# Patient Record
Sex: Male | Born: 1950
Health system: Southern US, Community
[De-identification: ages and names within clinical notes are randomized; demographics above are authoritative.]

## PROBLEM LIST (undated history)

## (undated) DIAGNOSIS — M75102 Unspecified rotator cuff tear or rupture of left shoulder, not specified as traumatic: Secondary | ICD-10-CM

## (undated) DIAGNOSIS — N200 Calculus of kidney: Secondary | ICD-10-CM

## (undated) DIAGNOSIS — K219 Gastro-esophageal reflux disease without esophagitis: Secondary | ICD-10-CM

## (undated) DIAGNOSIS — G4730 Sleep apnea, unspecified: Secondary | ICD-10-CM

## (undated) DIAGNOSIS — E119 Type 2 diabetes mellitus without complications: Secondary | ICD-10-CM

## (undated) DIAGNOSIS — I10 Essential (primary) hypertension: Secondary | ICD-10-CM

## (undated) DIAGNOSIS — E785 Hyperlipidemia, unspecified: Secondary | ICD-10-CM

## (undated) DIAGNOSIS — A159 Respiratory tuberculosis unspecified: Secondary | ICD-10-CM

## (undated) DIAGNOSIS — R9439 Abnormal result of other cardiovascular function study: Secondary | ICD-10-CM

## (undated) DIAGNOSIS — M1990 Unspecified osteoarthritis, unspecified site: Secondary | ICD-10-CM

## (undated) DIAGNOSIS — I2510 Atherosclerotic heart disease of native coronary artery without angina pectoris: Secondary | ICD-10-CM

## (undated) DIAGNOSIS — I739 Peripheral vascular disease, unspecified: Secondary | ICD-10-CM

## (undated) HISTORY — PX: CORONARY ANGIOPLASTY: SHX604

## (undated) HISTORY — PX: CARDIAC CATHETERIZATION: SHX172

---

## 1978-11-13 HISTORY — PX: KNEE ARTHROSCOPY: SHX127

## 2011-02-11 ENCOUNTER — Encounter (HOSPITAL_BASED_OUTPATIENT_CLINIC_OR_DEPARTMENT_OTHER): Payer: Self-pay | Admitting: *Deleted

## 2011-02-11 NOTE — Pre-Procedure Instructions (Signed)
EKG and cardiology note reviewed by Dr. Krista Blue; pt. needs cardiac clearance for shoulder surg.  Raylene Miyamoto at Dr. Nolon Nations office notified.  Wife notified also of need for cardiac clearance; she will also contact Dr. Verl Dicker office for appt.

## 2011-02-11 NOTE — Pre-Procedure Instructions (Addendum)
To come for BMET  EKG and office note req. from Dr. Cletis Media office  Sleep study was done in [place]

## 2011-02-12 NOTE — H&P (Signed)
Cory Clarke is an 60 y.o. male.   Chief Complaint: left shoulder pain HPI: pt is having increasing l shoulder pain despite injection and therapeutic exercise. Recent mri scan shows a rotator cuff tear and mild djd.    Past Medical History  Diagnosis Date  . Hypertension     under control; has been on med. x 1 yr.  . Arthritis     shoulder and knee  . Diabetes mellitus     IDDM  . Hyperlipemia   . Rotator cuff tear, left     AC joint DJD  . Sleep apnea     no CPAP - did not return for follow-up after sleep study  . Peripheral vascular disease     blockages in legs    Past Surgical History  Procedure Date  . Knee surgery 1980s or 1990s  . Tonsillectomy as a child    History reviewed. No pertinent family history. Social History:  reports that he has been smoking Cigarettes.  He has a 20 pack-year smoking history. He has never used smokeless tobacco. He reports that he drinks alcohol. He reports that he does not use illicit drugs.  Allergies: Not on File  No current facility-administered medications on file as of .   No current outpatient prescriptions on file as of .    No results found for this or any previous visit (from the past 48 hour(s)). No results found.  Review of Systems  Constitutional: Negative.   HENT: Negative.   Eyes: Negative.   Respiratory: Negative.   Cardiovascular:       Positive for femoral artery blockage  Gastrointestinal: Negative.   Genitourinary: Negative.   Musculoskeletal:       Left shoulder pain  Skin: Negative.   Neurological: Negative.   Endo/Heme/Allergies:       Positive for dm2  Psychiatric/Behavioral: Negative.     Height 5\' 9"  (1.753 m), weight 83.915 kg (185 lb). Physical Exam  Constitutional: He is oriented to person, place, and time. He appears well-nourished.  HENT:  Head: Atraumatic.  Eyes: Pupils are equal, round, and reactive to light.  Neck: Normal range of motion.  Cardiovascular: Normal rate and regular  rhythm.   Respiratory: Effort normal and breath sounds normal.  GI: Soft.  Musculoskeletal:       Left shoulder -from, pain with impingement testing. Some cuff weakness  Neurological: He is alert and oriented to person, place, and time. He has normal reflexes.  Skin: Skin is warm.  Psychiatric: He has a normal mood and affect.     Assessment/Plan Left shoulder impingement and rct with mid djd. P:arthroscopy l shoulder with rcr to improve his condition  Lille Karim R 02/12/2011, 3:35 PM

## 2011-02-15 ENCOUNTER — Encounter (HOSPITAL_BASED_OUTPATIENT_CLINIC_OR_DEPARTMENT_OTHER): Admission: RE | Payer: Self-pay | Source: Ambulatory Visit

## 2011-02-15 ENCOUNTER — Ambulatory Visit (HOSPITAL_BASED_OUTPATIENT_CLINIC_OR_DEPARTMENT_OTHER): Admission: RE | Admit: 2011-02-15 | Payer: 59 | Source: Ambulatory Visit | Admitting: Orthopaedic Surgery

## 2011-02-15 HISTORY — DX: Hyperlipidemia, unspecified: E78.5

## 2011-02-15 HISTORY — DX: Unspecified osteoarthritis, unspecified site: M19.90

## 2011-02-15 HISTORY — DX: Unspecified rotator cuff tear or rupture of left shoulder, not specified as traumatic: M75.102

## 2011-02-15 HISTORY — DX: Essential (primary) hypertension: I10

## 2011-02-15 HISTORY — DX: Peripheral vascular disease, unspecified: I73.9

## 2011-02-15 HISTORY — DX: Sleep apnea, unspecified: G47.30

## 2011-02-15 SURGERY — ARTHROSCOPY, SHOULDER
Anesthesia: Choice | Laterality: Left

## 2011-03-17 ENCOUNTER — Other Ambulatory Visit: Payer: Self-pay | Admitting: Orthopaedic Surgery

## 2011-03-18 ENCOUNTER — Encounter (HOSPITAL_COMMUNITY): Payer: Self-pay

## 2011-03-24 ENCOUNTER — Inpatient Hospital Stay (HOSPITAL_COMMUNITY): Admission: RE | Admit: 2011-03-24 | Payer: 59 | Source: Ambulatory Visit

## 2011-03-29 ENCOUNTER — Encounter (HOSPITAL_COMMUNITY): Admission: RE | Payer: Self-pay | Source: Ambulatory Visit

## 2011-03-29 ENCOUNTER — Ambulatory Visit (HOSPITAL_COMMUNITY): Admission: RE | Admit: 2011-03-29 | Payer: 59 | Source: Ambulatory Visit | Admitting: Orthopaedic Surgery

## 2011-03-29 SURGERY — ARTHROSCOPY, SHOULDER
Anesthesia: General | Laterality: Left

## 2011-04-11 ENCOUNTER — Encounter (HOSPITAL_COMMUNITY): Payer: Self-pay | Admitting: Cardiology

## 2011-04-11 MED ORDER — CEFAZOLIN SODIUM-DEXTROSE 2-3 GM-% IV SOLR
2.0000 g | INTRAVENOUS | Status: DC
  Filled 2011-04-11: qty 50

## 2011-04-11 MED ORDER — LACTATED RINGERS IV SOLN: INTRAVENOUS | Status: DC

## 2011-04-11 NOTE — H&P (Signed)
CC f/u to discuss possible pv procedure per pt's request. dks (Dr Pixie Casino) (Appt time: 11:45 AM) (Arrival time: 11:44 AM) 5 HOPI: Patient was scheduled for left shoulder surgery. However patient needed cardiac risk modification. He has not had any stress test in greater than 2 years and was felt to be high risk for peri operative events given his multiple cardiovascular risks and severe peripheral artery disease. He underwent lexi scan nuclear stress test and presents here for followup.  Eyes: No blurred or double vision. Head: No headaches or migraines. Chest: No cough or shortness of breath. Heart: No chest pain, dyspnea, palpitations, syncope or hemoptysis. Patient c/o symptoms of cramping or discomfort in the lower extremities. Does not have leg cramping at night in either leg(s). The pain/discomfort is mostly located in bilateral calf. Starts after walking for 1/2 blocks to 2-300 feet. Relieved by rest for 30 seconds and then walks again. The pain is localized in the calf bilateral; left and ; Right equally affected. Pain is worse with activity and is easily relieved by resting. Has affected his lifestyle. H/0 Erectile dysfunction is present.   ROS: Arthritis bilateral knee and is s/p miniscus tear left knee, life-style limiting Mildly; new left shoulder rotator cuff injury. Patient states that this is getting better over time. Cramping of the legs and feet at night or with activity YES; Diabetes YES, Controlled YES; Hypothyroidism NO; Previous Stroke/TIA NO; Previous Gl Bleed NO; Erectile dysfunction YES used viagra in the past ; Recent weight change NO; Symptoms of obstructive sleep apnea NO; Other systems negative.   Past Medical History (Major events, hospitalizations, surgeries): Orthoscopic surgery on left knee in late 80's. Colonoscopy 2006.  Known allergies: NKDA.  Ongoing medical problems: Diabetes Type 2. Hypertension. Past history of hyperlipidemia. Poor circulation in the  legs.  Family medical history: Both parents diseased. Mom died at age 77 from cirrhosis of the liver. Dad died in early 84's from Choking on piece of meat. 2 brothers, one with glaucoma.  Social history: Married, with 3 kids, all in good health. Smoker (smokes 2 packs/day). Consumes occasional alcoholic beverages.   GENERAL: Moderately built and normal body habitus, who is in no acute distress. Alert and oriented x 3. Appears stated age. Height: I 66})] in Weight 132: Blood Pressure: 130 / 70 mmHg Pulse:72 bpm There is no cyanosis. CAROTIDS: No carotid bruits are noted. CARDIAC EXAM: 51 52 normal, no gallop or murmur. CHEST EXAM: No tenderness of chest wall. LUNGS: Clear to percuss and auscultate. No rales or ronchi. ABDOMEN: No hepatosplenomegaly. BS normal in all 4 quadrants. Abdomen is non-tender. EXTREMITY: No edema. MUSCULOSKELETAL EXAM: Intact with full range of motion in all 4 extremities. NEUROLOGIC EXAM: Grossly intact without any foca deficits. Alert 0 x 3. VASCULAR EXAM: No skin breakdown. Carotids Normal. Extremities: Femoral pulse normal with soft left worse than the right bruit. Popliteal pulse not palpable ; Pedal pulse absent. No prominent pulse felt in the abdomen. No varicose veins.  Assessment: 1. Abnormal stress test Suggesting multivessel coronary artery disease. Lexiscan stress 03/18/11: Mild LV dilatation. EF 46%. Cannot r/o balanced ischemia, multivessel CAD. Stress test suggests high risk for perioperative cardiovascular events. Needs further evaluation. 2. PAD/Claudication: Bilateral calf. Patient c/o symptoms of cramping or discomfort in the lower extremities. Does not have leg cramping at night in either leg(s). The pain/discomfort is mostly located in bilateral calf. Starts after walking for 1/2 blocks to 2-300 feet. LE arterial duplex 01/17/11; RABI 0.53, LAB I 0.47  suggesting severe PAD. Bilateral SFA stenosis. 3. Diabetes Mellitus II controlled. 4.  Hyperlipidemia. 5. Tobacco use disorder. Patient did not tolerate Chantix. He plans on quitting smoking soon.   DIAGNOSES: Atherosclerosis of native arteries of the extremities with intermittent claudication (440.21] type II diabetes mellitus (non-insulin dependent type) (NIDDM type) [adult-onset type) or unspecified type, not stated as uncontrolled, without mention of complication [250.00] Pure hypercholesterolemia [272.0] History of tobacco use (V15.82]   Plan:   Mechanism of underlying disease process and action of medications discussed with the patient. I discussed primary/secondary prevention and also dietary counceling was done. Continued tobcco use disorder: I have again reinforced abstinance. Unfortunatly continues to use. Continue current medications unchanged. Schedule for cardiac catheterization. We discussed regarding risks, benefits, alternatives to this including stress testing, CTA and continued medical therapy. Patient wants to proceed. Understands <1-2% risk of death, stroke, Ml, urgent CABG, bleeding, infection, renal failure but not limited to these. Patient was initially scheduled for left shoulder surgery but it has since been postponed due to abnormal stress test. Throat patient states that he would like to proceed with evaluation of his peripheral arterial disease as he states that this has been lifestyle limiting and he states that his shoulder has not been bothering her as much as it use to previously. I left this for the patient to decide but I am more concerned about coronary artery disease. I will make further recommendation of the cardiac catheterization.  REFERRALS: [facility] (Internal Medicine) Fax: [phone] Phone: [phone]

## 2011-04-12 ENCOUNTER — Encounter (HOSPITAL_COMMUNITY)
Admission: RE | Disposition: A | Discharge status: Home / Self Care | Payer: Self-pay | Source: Ambulatory Visit | Attending: Cardiology

## 2011-04-12 ENCOUNTER — Ambulatory Visit (HOSPITAL_COMMUNITY)
Admission: RE | Admit: 2011-04-12 | Discharge: 2011-04-12 | Disposition: A | Discharge status: Home / Self Care | Payer: 59 | Source: Ambulatory Visit | Attending: Cardiology | Admitting: Cardiology

## 2011-04-12 ENCOUNTER — Other Ambulatory Visit: Payer: Self-pay

## 2011-04-12 DIAGNOSIS — R9431 Abnormal electrocardiogram [ECG] [EKG]: Secondary | ICD-10-CM | POA: Insufficient documentation

## 2011-04-12 DIAGNOSIS — I2582 Chronic total occlusion of coronary artery: Secondary | ICD-10-CM | POA: Insufficient documentation

## 2011-04-12 DIAGNOSIS — I10 Essential (primary) hypertension: Secondary | ICD-10-CM | POA: Insufficient documentation

## 2011-04-12 DIAGNOSIS — I70219 Atherosclerosis of native arteries of extremities with intermittent claudication, unspecified extremity: Secondary | ICD-10-CM | POA: Insufficient documentation

## 2011-04-12 DIAGNOSIS — E7800 Pure hypercholesterolemia, unspecified: Secondary | ICD-10-CM | POA: Insufficient documentation

## 2011-04-12 DIAGNOSIS — R9439 Abnormal result of other cardiovascular function study: Secondary | ICD-10-CM | POA: Insufficient documentation

## 2011-04-12 DIAGNOSIS — I2510 Atherosclerotic heart disease of native coronary artery without angina pectoris: Secondary | ICD-10-CM | POA: Insufficient documentation

## 2011-04-12 DIAGNOSIS — E119 Type 2 diabetes mellitus without complications: Secondary | ICD-10-CM | POA: Insufficient documentation

## 2011-04-12 DIAGNOSIS — Z87891 Personal history of nicotine dependence: Secondary | ICD-10-CM | POA: Insufficient documentation

## 2011-04-12 HISTORY — DX: Abnormal result of other cardiovascular function study: R94.39

## 2011-04-12 HISTORY — PX: LEFT HEART CATHETERIZATION WITH CORONARY ANGIOGRAM: SHX5451

## 2011-04-12 LAB — GLUCOSE, CAPILLARY: Glucose-Capillary: 104 mg/dL — ABNORMAL HIGH (ref 70–99)

## 2011-04-12 SURGERY — LEFT HEART CATHETERIZATION WITH CORONARY ANGIOGRAM
Anesthesia: LOCAL

## 2011-04-12 MED ORDER — HEPARIN (PORCINE) IN NACL 2-0.9 UNIT/ML-% IJ SOLN
INTRAMUSCULAR | Status: AC
  Filled 2011-04-12: qty 2000

## 2011-04-12 MED ORDER — NITROGLYCERIN 0.2 MG/ML ON CALL CATH LAB
INTRAVENOUS | Status: AC
  Filled 2011-04-12: qty 1

## 2011-04-12 MED ORDER — SODIUM CHLORIDE 0.9 % IV SOLN
INTRAVENOUS | Status: DC
  Administered 2011-04-12: 1000 mL via INTRAVENOUS

## 2011-04-12 MED ORDER — SODIUM CHLORIDE 0.9 % IV SOLN: 1.0000 mL/kg/h | INTRAVENOUS | Status: DC

## 2011-04-12 MED ORDER — MIDAZOLAM HCL 2 MG/2ML IJ SOLN
INTRAMUSCULAR | Status: AC
  Filled 2011-04-12: qty 2

## 2011-04-12 MED ORDER — ASPIRIN 81 MG PO CHEW
324.0000 mg | CHEWABLE_TABLET | ORAL | Status: AC
  Administered 2011-04-12: 324 mg via ORAL
  Filled 2011-04-12: qty 4

## 2011-04-12 MED ORDER — SODIUM CHLORIDE 0.9 % IV SOLN: 250.0000 mL | INTRAVENOUS | Status: DC | PRN

## 2011-04-12 MED ORDER — BIVALIRUDIN 250 MG IV SOLR
INTRAVENOUS | Status: AC
  Filled 2011-04-12: qty 250

## 2011-04-12 MED ORDER — DEXTROSE 50 % IV SOLN
50.0000 mL | Freq: Once | INTRAVENOUS | Status: AC
  Administered 2011-04-12: 50 mL via INTRAVENOUS

## 2011-04-12 MED ORDER — ONDANSETRON HCL 4 MG/2ML IJ SOLN: 4.0000 mg | Freq: Four times a day (QID) | INTRAMUSCULAR | Status: DC | PRN

## 2011-04-12 MED ORDER — DEXTROSE 50 % IV SOLN
INTRAVENOUS | Status: AC
  Filled 2011-04-12: qty 50

## 2011-04-12 MED ORDER — VERAPAMIL HCL 2.5 MG/ML IV SOLN
INTRAVENOUS | Status: AC
  Filled 2011-04-12: qty 2

## 2011-04-12 MED ORDER — HYDROMORPHONE HCL PF 2 MG/ML IJ SOLN
INTRAMUSCULAR | Status: AC
  Filled 2011-04-12: qty 1

## 2011-04-12 MED ORDER — ACETAMINOPHEN 325 MG PO TABS: 650.0000 mg | ORAL_TABLET | ORAL | Status: DC | PRN

## 2011-04-12 MED ORDER — SODIUM CHLORIDE 0.9 % IJ SOLN: 3.0000 mL | INTRAMUSCULAR | Status: DC | PRN

## 2011-04-12 MED ORDER — ASPIRIN 81 MG PO CHEW: 81.0000 mg | CHEWABLE_TABLET | Freq: Every day | ORAL | Status: DC

## 2011-04-12 MED ORDER — SODIUM CHLORIDE 0.9 % IJ SOLN: 3.0000 mL | Freq: Two times a day (BID) | INTRAMUSCULAR | Status: DC

## 2011-04-12 MED ORDER — HEPARIN SODIUM (PORCINE) 1000 UNIT/ML IJ SOLN
INTRAMUSCULAR | Status: AC
  Filled 2011-04-12: qty 1

## 2011-04-12 MED ORDER — LIDOCAINE HCL (PF) 1 % IJ SOLN
INTRAMUSCULAR | Status: AC
  Filled 2011-04-12: qty 30

## 2011-04-12 NOTE — Cardiovascular Report (Signed)
NAME:  Cory, Clarke NO.:  [id number]  MEDICAL RECORD NO.:  [id number]  LOCATION:  [facility]                         FACILITY:  [facility]  PHYSICIAN:  [name], MD DATE OF BIRTH:  November 14, 1950  DATE OF PROCEDURE: DATE OF DISCHARGE:                           CARDIAC CATHETERIZATION   PROCEDURE PERFORMED:  Left heart catheterization including: 1. Left ventriculography. 2. Selective right and left coronary arteriography. 3. Percutaneous transluminal coronary angioplasty and angioplasty of     the chronically, totally occluded right coronary artery.  INDICATION:  Cory Clarke is a pleasant 61 year old gentleman with history of hyperlipidemia, diabetes, hypertension who was referred for preoperative cardiac risk evaluation.  He also has severe peripheral arterial disease.  He underwent outpatient stress testing on March 18, 2011, which had revealed ejection fraction of 46% and very strongly positive EKG changes of ischemia.  The multivessel coronary artery disease was suspected.  Given his multiple cardiovascular risk factors, he is now brought to the cardiac catheterization lab to evaluate his coronary anatomy.  HEMODYNAMIC DATA:  The left ventricular pressure was 97/4 with end- diastolic pressure of 7 mmHg.  Aortic pressure was 102/63 with a mean of 80 mmHg.  There was no pressure gradient across the aortic valve.  ANGIOGRAPHIC DATA:  Left ventricle.  Left ventricle systolic function was preserved with an ejection fraction of 50-55%.  There was no significant wall motion abnormality.  Left main coronary artery.  Left main coronary artery is a large-caliber vessel and is calcified without any luminal obstruction.  LAD.  LAD is a large-caliber vessel, giving origin to a moderate-sized diagonal 1, which has bifurcating branches.  This diagonal 1 has a 70% ostial stenosis.  It was extremely difficult to visualize this diagonal branch of the LAD in spite of  multiple angles.  It also gives origin to a large diagonal 2.  Again diffuse coronary calcification was evident throughout the proximal and mid LAD.  Ramus intermediate.  Ramus intermediate is a very large-caliber vessel with ostial 40% stenosis.  Distally, it is smooth and normal.  Circumflex artery.  Circumflex is a very large-caliber vessel.  It has an ostial 30% stenosis and a mid 40% stenosis.  Gives origin to a large obtuse marginal 1, which has a bifurcating high-grade 80% stenosis. This obtuse marginal 1 is moderate-sized vessel.  Distal circumflex itself has a focal 80% stenosis.  This distal circumflex coronary artery gives extensive collaterals to the right coronary artery.  Right coronary artery.  Right coronary artery is a large-caliber vessel and a dominant vessel.  It gives origin to large PDA and PL branches. It is occluded in the mid segment.  It has retrograde collaterals from the circumflex coronary artery and fills all the way back up to the RV branch.  INTERVENTION DATA:  I have carefully re-evaluated the coronary angiograms where the circumflex coronary artery stenoses are easily revascularizable by percutaneous coronary intervention.  However, given the fact that the right coronary artery is very large and the occlusion appeared to be short, I felt that it is feasible to proceed with intervention to the right coronary artery and if successful, do a staged intervention to the circumflex  coronary artery.  If there is a failure, consideration is for a RIMA to the right coronary artery with balloon angioplasty and stenting of the circumflex would also be contemplated.  The diagonal 1 although in the RAO cranial view appears to have high- grade stenosis, in the other views appeared to be at most 70%.  Also, the vessel is not necessarily very conducive for angioplasty although it is doable.  I attempted to angioplasty the CTO of the right coronary artery  with utilization of a [employer] 6.6 g.  I was able to cross the CTO. A 1.25 x 10-mm Sprinter Legend balloon was utilized for backup support. After I crossed the CTO, the distal vessel tortuosity was evident with heavy calcification.  Then, as the 1.25-mm balloon could not cross even after angioplasty, I decided to use a Finecross catheter.  Again, I was unable to cross the proximal cap.  After multiple attempts, I exchanged the [employer] wire to a [employer] wire.  In spite of this, I was able to get into the side branch but not into the true lumen of the distal right coronary artery.  Hence, the wires were withdrawn, angiography was repeated, guide catheter was disengaged and pulled out of the body.  RECOMMENDATION:  I will discuss the case again in detail with my colleagues and also with the patient and see whether repeat attempt should be performed of the CTO versus sending him for one-vessel bypass surgery versus continued medical therapy.  The circumflex coronary artery lesions are relatively state forward and conducive for angioplasty.  I will make further recommendation after review.  I have again discussed with the patient regarding smoking cessation and risk modification.  TECHNIQUE OF THE PROCEDURE:  Under sterile precautions, using a 6-French right radial access, a 5-French TIG #4 catheter was advanced into the ascending aorta and the left main coronary artery was selective engaged, and angiography was performed.  The catheter was also advanced into the left ventricle, and angiography was performed.  The catheter was then utilized to engage the right coronary artery, and angiography was repeated.  TECHNIQUE OF INTERVENTION:  Using heparin for anticoagulation, maintaining ACT greater than 250, I utilized an Ikari right 1.0 guide catheter to engage the right coronary artery.  I utilized a [employer] 6, followed by [employer], 1.25 x 10-mm Sprinter  Legend balloon, and a Finecross catheter to attempt angioplasty of the CTO. Because of inability to cross through the distal cap, the lesions were left alone.  The wires were withdrawn, angiography was repeated, guide catheter was disengaged and pulled out of the body.  The patient tolerated the procedure.  The patient did have transient chest discomfort which resolved at the end of the procedure.  There was no dissection or any perforation evident at the end of the angiography. The patient can safely be discharged home today with outpatient followup.     [name], MD     JRG/MEDQ  D:  04/12/2011  T:  04/12/2011  Job:  [id number]  cc:   Massie Maroon, MD

## 2011-04-12 NOTE — Brief Op Note (Signed)
  PATIENT: Cory Clarke  PRE-OPERATIVE DIAGNOSIS:  Abnormal stress test   POST-OPERATIVE DIAGNOSIS: CAD  PROCEDURE (S):  leftHEART CATHETERIZATION WITH CORONARY/LV ANGIOGRAM, PCI to CTO RCA failed  Cardiologist: Jeanella Cara, MD, [facility].:    Referring MD: PCP      DICTATION: .Other Dictation: Dictation Number [id number]   PATIENT DISPOSITION:  Short Stay

## 2011-04-12 NOTE — Interval H&P Note (Signed)
History and Physical Interval Note:  04/12/2011 7:40 AM  Cory Clarke  has presented today for surgery, with the diagnosis of positive stress test  The various methods of treatment have been discussed with the patient and family. After consideration of risks, benefits and other options for treatment, the patient has consented to  Procedure(s): LEFT HEART CATHETERIZATION WITH CORONARY ANGIOGRAM as a surgical intervention .  The patients' history has been reviewed, patient examined, no change in status, stable for surgery.  I have reviewed the patients' chart and labs.  Questions were answered to the patient's satisfaction.     [name],JAGADEESH R  No change since prior evaluation

## 2011-04-14 LAB — GLUCOSE, CAPILLARY: Glucose-Capillary: 66 mg/dL — ABNORMAL LOW (ref 70–99)

## 2011-04-14 MED FILL — Dextrose Inj 5%: INTRAVENOUS | Qty: 50 | Status: AC

## 2011-04-27 ENCOUNTER — Encounter (HOSPITAL_COMMUNITY): Payer: Self-pay | Admitting: Pharmacy Technician

## 2011-05-09 MED ORDER — CHLORHEXIDINE GLUCONATE 4 % EX LIQD
60.0000 mL | Freq: Once | CUTANEOUS | Status: DC
  Filled 2011-05-09: qty 60

## 2011-05-10 ENCOUNTER — Other Ambulatory Visit: Payer: Self-pay

## 2011-05-10 ENCOUNTER — Ambulatory Visit (HOSPITAL_COMMUNITY)
Admission: RE | Admit: 2011-05-10 | Discharge: 2011-05-10 | Disposition: A | Discharge status: Home / Self Care | Payer: 59 | Source: Ambulatory Visit | Attending: Cardiology | Admitting: Cardiology

## 2011-05-10 ENCOUNTER — Encounter (HOSPITAL_COMMUNITY)
Admission: RE | Disposition: A | Discharge status: Home / Self Care | Payer: Self-pay | Source: Ambulatory Visit | Attending: Cardiology

## 2011-05-10 DIAGNOSIS — E119 Type 2 diabetes mellitus without complications: Secondary | ICD-10-CM | POA: Insufficient documentation

## 2011-05-10 DIAGNOSIS — I2510 Atherosclerotic heart disease of native coronary artery without angina pectoris: Secondary | ICD-10-CM | POA: Insufficient documentation

## 2011-05-10 DIAGNOSIS — I739 Peripheral vascular disease, unspecified: Secondary | ICD-10-CM | POA: Insufficient documentation

## 2011-05-10 DIAGNOSIS — I10 Essential (primary) hypertension: Secondary | ICD-10-CM | POA: Insufficient documentation

## 2011-05-10 DIAGNOSIS — I700 Atherosclerosis of aorta: Secondary | ICD-10-CM | POA: Insufficient documentation

## 2011-05-10 HISTORY — PX: LOWER EXTREMITY ANGIOGRAM: SHX5508

## 2011-05-10 HISTORY — PX: CORONARY ANGIOGRAM: SHX5786

## 2011-05-10 HISTORY — PX: ABDOMINAL AORTAGRAM: SHX5454

## 2011-05-10 LAB — GLUCOSE, CAPILLARY
Glucose-Capillary: 115 mg/dL — ABNORMAL HIGH (ref 70–99)
Glucose-Capillary: 100 mg/dL — ABNORMAL HIGH (ref 70–99)

## 2011-05-10 SURGERY — ANGIOGRAM, LOWER EXTREMITY
Anesthesia: LOCAL

## 2011-05-10 MED ORDER — ADENOSINE 12 MG/4ML IV SOLN
12.0000 mL | Freq: Once | INTRAVENOUS | Status: DC
  Filled 2011-05-10: qty 12

## 2011-05-10 MED ORDER — ASPIRIN 81 MG PO CHEW
CHEWABLE_TABLET | ORAL | Status: AC
  Administered 2011-05-10: 324 mg
  Filled 2011-05-10: qty 4

## 2011-05-10 MED ORDER — HEPARIN (PORCINE) IN NACL 2-0.9 UNIT/ML-% IJ SOLN
INTRAMUSCULAR | Status: AC
  Filled 2011-05-10: qty 1000

## 2011-05-10 MED ORDER — ACETAMINOPHEN 325 MG PO TABS: 650.0000 mg | ORAL_TABLET | ORAL | Status: DC | PRN

## 2011-05-10 MED ORDER — FAMOTIDINE IN NACL 20-0.9 MG/50ML-% IV SOLN
INTRAVENOUS | Status: AC
  Administered 2011-05-10: 20 mg via INTRAVENOUS
  Filled 2011-05-10: qty 50

## 2011-05-10 MED ORDER — SODIUM CHLORIDE 0.9 % IV SOLN: INTRAVENOUS | Status: DC

## 2011-05-10 MED ORDER — HYDROMORPHONE HCL PF 2 MG/ML IJ SOLN
INTRAMUSCULAR | Status: AC
  Filled 2011-05-10: qty 1

## 2011-05-10 MED ORDER — ONDANSETRON HCL 4 MG/2ML IJ SOLN: 4.0000 mg | Freq: Four times a day (QID) | INTRAMUSCULAR | Status: DC | PRN

## 2011-05-10 MED ORDER — MIDAZOLAM HCL 2 MG/2ML IJ SOLN
INTRAMUSCULAR | Status: AC
  Filled 2011-05-10: qty 2

## 2011-05-10 MED ORDER — FAMOTIDINE IN NACL 20-0.9 MG/50ML-% IV SOLN
20.0000 mg | Freq: Once | INTRAVENOUS | Status: AC
  Administered 2011-05-10: 20 mg via INTRAVENOUS

## 2011-05-10 MED ORDER — FENTANYL CITRATE 0.05 MG/ML IJ SOLN
50.0000 ug | Freq: Once | INTRAMUSCULAR | Status: AC
  Administered 2011-05-10: 15:00:00 via INTRAVENOUS

## 2011-05-10 MED ORDER — SODIUM CHLORIDE 0.9 % IJ SOLN: 3.0000 mL | INTRAMUSCULAR | Status: DC | PRN

## 2011-05-10 MED ORDER — SODIUM CHLORIDE 0.9 % IV SOLN
INTRAVENOUS | Status: DC
  Administered 2011-05-10: 07:00:00 via INTRAVENOUS

## 2011-05-10 MED ORDER — SODIUM CHLORIDE 0.9 % IJ SOLN: 3.0000 mL | Freq: Two times a day (BID) | INTRAMUSCULAR | Status: DC

## 2011-05-10 MED ORDER — BIVALIRUDIN 250 MG IV SOLR
INTRAVENOUS | Status: AC
  Filled 2011-05-10: qty 250

## 2011-05-10 MED ORDER — ASPIRIN 81 MG PO CHEW: 324.0000 mg | CHEWABLE_TABLET | ORAL | Status: DC

## 2011-05-10 MED ORDER — LIDOCAINE HCL (PF) 1 % IJ SOLN
INTRAMUSCULAR | Status: AC
  Filled 2011-05-10: qty 30

## 2011-05-10 NOTE — CV Procedure (Signed)
Left heart cath, FFR to mid LAD, Abd Ao gram and bifemoral runoff. Tripple vessel CAD. Needs CABG.  Bilateral Short SFA occlusion eventually need angioplasty after coronary issues taken care of.  Procedure performed: 1. Coronary angiography and evaluation of LAD physiologic significance by FFR. 2. Abdominal aortogram 3. Abdominal aortogram with bifemoral runoff.  Indications: Patient is 61 year old male  with diabetes Presenting for evaluation of LAD CAD significance.  Dictation # [id number]

## 2011-05-10 NOTE — Cardiovascular Report (Signed)
Cory Clarke, Cory Clarke NO.:  [id number]  MEDICAL RECORD NO.:  [id number]  LOCATION:  [facility]                         FACILITY:  [facility]  PHYSICIAN:  [name], MD DATE OF BIRTH:  April 26, 1950  DATE OF PROCEDURE:  05/10/2011 DATE OF DISCHARGE:                           CARDIAC CATHETERIZATION   PROCEDURE PERFORMED: 1. Left coronary arteriogram. 2. Fractional flow reserve calculation of the mid left anterior     descending. 3. Abdominal aortogram with bifemoral runoff.  INDICATIONS:  Cory Clarke is a 61 year old gentleman with history of diabetes who has known peripheral arterial disease and coronary artery disease.  He had undergone outpatient stress testing which was abnormal revealing multivessel coronary artery disease.  Ejection fraction 45%. He had undergone cardiac catheterization on April 12, 2011, and at that time, had revealed preserved ejection fraction.  Right coronary artery was completely occluded in the proximal segment, which I had failed to angioplasty this because of inability to revascularize the CTO.  He was also found to have a high-grade stenosis in the distal circumflex, which was giving collaterals to the dominant right coronary artery.  Hence, he also had an intermediate stenosis in the LAD.  At that point, we had decided to proceed with FFR-guided angioplasty to the circumflex if the FFR was negative for the LAD stenosis.  However, given his diabetic state and multivessel disease if the LAD stenosis was significant, we were opting for coronary artery bypass grafting.  Abdominal aortogram with bifemoral runoff was performed to evaluate for peripheral arterial disease.  His ABI was 0.53 at 0.47 on the right and left respectively with evidence of bilateral calf claudication.  ANGIOGRAPHIC DATA:  Left main coronary artery:  Left main coronary artery was large caliber vessel with severe calcification.  There is a shelf-like  calcification noted at the superior margin.  LAD:  LAD is a large caliber vessel.  There is a 50% stenosis noted in the mid LAD.  However, by FFR, it was physiologically significant with the FFR dropping to 0.75 within 30 seconds of adenosine administration. The LAD gives origin to a very large diagonal 1, which also has a high- grade 90% stenosis.  It is at least moderate size.  Circumflex:  The circumflex is a large caliber vessel.  The previously noted distal circumflex coronary artery stenosis which was about 80-90%, now appears to be 60-70%.  The obtuse marginal 1 shows a 50-60% stenosis.  Proximal to mid segment of the large circumflex shows a 60% stenosis.  I suspect this is probably physiologically significant.  Ramus intermediate:  Ramus intermediate is a very large caliber vessel with severe proximal calcification.  There is a 30% stenosis in the ostial ramus intermediate.  DISCUSSION:  In a patient with very large right coronary artery which is collateralized by the LAD and circumflex coronary artery, the fact that the LAD stenosis is physiologically significant, and there is a large diagonal which has got ostial lesion which is not well visualized anatomically to place a stent.  The patient will be incompletely revascularized if we perform angioplasty to the diagonal and LAD leaving the right coronary artery which is large and not revascularized.  The  stress testing had also revealed severe multivessel coronary artery disease with LV cavity dilatation and decreasing ejection fraction of 46%.  Hence at this point, the best option long-term in a diabetic patient will be coronary artery bypass grafting involving the LIMA to LAD and bypassing the large diagonal and also bypassing the circumflex coronary artery and right coronary artery.  Abdominal aortogram:  Abdominal aortogram revealed 2 renal arteries, one on either sides and they were widely patent.  Mild calcification of  the abdominal aorta and mild luminal irregularity was evident.  Aortoiliac bifurcation was widely patent.  Right common iliac artery showed a 30% stenosis.  Abdominal aortogram with bifemoral runoff:  Abdominal aortogram with bifemoral runoff revealed diffuse severe calcification throughout the right superficial and left superficial femoral artery.  There is bilateral short segment occlusion of the superficial femoral artery.  In a similar fashion, just at the distal end of the adaptor canal.  Below the knee, there is three-vessel runoff bilaterally.  RECOMMENDATION:  The patient will need coronary artery bypass grafting first before we proceed with peripheral angiography and angioplasty of the same.  Both the lesions are amenable for percutaneous revascularization.  But however, I would prefer to perform these procedures only after his coronary anatomy has been evaluated and fixed.  The patient will be discharged home with outpatient evaluation by cardiothoracic surgical associates of mine.  TECHNIQUE OF THE PROCEDURE:  Under sterile precautions using a 6-French right femoral artery access, a 6-French XB 3.5 guide catheter was advanced into the ascending aorta and using Angiomax for anticoagulation, FFR guidewire was advanced into the LAD.  Intravenous adenosine was administered in a standard fashion.  Immediately at 30 seconds into administration of intravenous adenosine, the patient dropped his FFR from normalization to 0.75.  At this point, we felt that the lesion was significant.  The wire was withdrawn.  The guide catheter was disengaged and pulled out of the body.  Abdominal aortogram with bifemoral runoff was performed using a 5-French pigtail catheter.  Then, the catheter was pulled out of body in usual fashion.  The patient tolerated the procedure.  No immediate complications noted.     [name], MD     JRG/MEDQ  D:  05/10/2011  T:  05/10/2011  Job:   [id number]  cc:   Cory Maroon, MD

## 2011-05-10 NOTE — Discharge Instructions (Signed)

## 2011-05-10 NOTE — H&P (View-Only) (Signed)
CC f/u to discuss possible pv procedure per pt's request. dks (Dr J Kim) (Appt time: 11:45 AM) (Arrival time: 11:44 AM) 5 HOPI: Patient was scheduled for left shoulder surgery. However patient needed cardiac risk modification. He has not had any stress test in greater than 2 years and was felt to be high risk for peri operative events given his multiple cardiovascular risks and severe peripheral artery disease. He underwent lexi scan nuclear stress test and presents here for followup.  Eyes: No blurred or double vision. Head: No headaches or migraines. Chest: No cough or shortness of breath. Heart: No chest pain, dyspnea, palpitations, syncope or hemoptysis. Patient c/o symptoms of cramping or discomfort in the lower extremities. Does not have leg cramping at night in either leg(s). The pain/discomfort is mostly located in bilateral calf. Starts after walking for 1/2 blocks to 2-300 feet. Relieved by rest for 30 seconds and then walks again. The pain is localized in the calf bilateral; left and ; Right equally affected. Pain is worse with activity and is easily relieved by resting. Has affected his lifestyle. H/0 Erectile dysfunction is present.   ROS: Arthritis bilateral knee and is s/p miniscus tear left knee, life-style limiting Mildly; new left shoulder rotator cuff injury. Patient states that this is getting better over time. Cramping of the legs and feet at night or with activity YES; Diabetes YES, Controlled YES; Hypothyroidism NO; Previous Stroke/TIA NO; Previous Gl Bleed NO; Erectile dysfunction YES used viagra in the past ; Recent weight change NO; Symptoms of obstructive sleep apnea NO; Other systems negative.   Past Medical History (Major events, hospitalizations, surgeries): Orthoscopic surgery on left knee in late 80's. Colonoscopy 2006.  Known allergies: NKDA.  Ongoing medical problems: Diabetes Type 2. Hypertension. Past history of hyperlipidemia. Poor circulation in the  legs.  Family medical history: Both parents diseased. Mom died at age 85 from cirrhosis of the liver. Dad died in early 70's from Choking on piece of meat. 2 brothers, one with glaucoma.  Social history: Married, with 3 kids, all in good health. Smoker (smokes 2 packs/day). Consumes occasional alcoholic beverages.   GENERAL: Moderately built and normal body habitus, who is in no acute distress. Alert and oriented x 3. Appears stated age. Height: I 66})] in Weight 132: Blood Pressure: 130 / 70 mmHg Pulse:72 bpm There is no cyanosis. CAROTIDS: No carotid bruits are noted. CARDIAC EXAM: 51 52 normal, no gallop or murmur. CHEST EXAM: No tenderness of chest wall. LUNGS: Clear to percuss and auscultate. No rales or ronchi. ABDOMEN: No hepatosplenomegaly. BS normal in all 4 quadrants. Abdomen is non-tender. EXTREMITY: No edema. MUSCULOSKELETAL EXAM: Intact with full range of motion in all 4 extremities. NEUROLOGIC EXAM: Grossly intact without any foca deficits. Alert 0 x 3. VASCULAR EXAM: No skin breakdown. Carotids Normal. Extremities: Femoral pulse normal with soft left worse than the right bruit. Popliteal pulse not palpable ; Pedal pulse absent. No prominent pulse felt in the abdomen. No varicose veins.  Assessment: 1. Abnormal stress test Suggesting multivessel coronary artery disease. Lexiscan stress 03/18/11: Mild LV dilatation. EF 46%. Cannot r/o balanced ischemia, multivessel CAD. Stress test suggests high risk for perioperative cardiovascular events. Needs further evaluation. 2. PAD/Claudication: Bilateral calf. Patient c/o symptoms of cramping or discomfort in the lower extremities. Does not have leg cramping at night in either leg(s). The pain/discomfort is mostly located in bilateral calf. Starts after walking for 1/2 blocks to 2-300 feet. LE arterial duplex 01/17/11; RABI 0.53, LAB I 0.47   suggesting severe PAD. Bilateral SFA stenosis. 3. Diabetes Mellitus II controlled. 4.  Hyperlipidemia. 5. Tobacco use disorder. Patient did not tolerate Chantix. He plans on quitting smoking soon.   DIAGNOSES: Atherosclerosis of native arteries of the extremities with intermittent claudication (440.21] type II diabetes mellitus (non-insulin dependent type) (NIDDM type) [adult-onset type) or unspecified type, not stated as uncontrolled, without mention of complication [250.00] Pure hypercholesterolemia [272.0] History of tobacco use (V15.82]   Plan:   Mechanism of underlying disease process and action of medications discussed with the patient. I discussed primary/secondary prevention and also dietary counceling was done. Continued tobcco use disorder: I have again reinforced abstinance. Unfortunatly continues to use. Continue current medications unchanged. Schedule for cardiac catheterization. We discussed regarding risks, benefits, alternatives to this including stress testing, CTA and continued medical therapy. Patient wants to proceed. Understands <1-2% risk of death, stroke, Ml, urgent CABG, bleeding, infection, renal failure but not limited to these. Patient was initially scheduled for left shoulder surgery but it has since been postponed due to abnormal stress test. Throat patient states that he would like to proceed with evaluation of his peripheral arterial disease as he states that this has been lifestyle limiting and he states that his shoulder has not been bothering her as much as it use to previously. I left this for the patient to decide but I am more concerned about coronary artery disease. I will make further recommendation of the cardiac catheterization.  REFERRALS: James Kim (Internal Medicine) Fax: [phone] Phone: [phone]  

## 2011-05-10 NOTE — Interval H&P Note (Signed)
History and Physical Interval Note:  05/10/2011 7:52 AM  Francene Finders  has presented today for surgery, with the diagnosis of cad/claudication  The various methods of treatment have been discussed with the patient and family. After consideration of risks, benefits and other options for treatment, the patient has consented to  Procedure(s) (LRB): LOWER EXTREMITY ANGIOGRAM (N/A) ABDOMINAL AORTAGRAM (N/A) PERCUTANEOUS CORONARY STENT INTERVENTION (PCI-S) (N/A) as a surgical intervention .  The patients' history has been reviewed, patient examined, no change in status, stable for surgery.  I have reviewed the patients' chart and labs.  Questions were answered to the patient's satisfaction.     Mandeep Ferch,JAGADEESH R  No change in the HPI. Patient scheduled for elective coronary angioplasty if FFR LAD normal. Also possible evaluation for PAD.

## 2011-05-11 MED FILL — Dextrose Inj 5%: INTRAVENOUS | Qty: 50 | Status: AC

## 2011-05-11 MED FILL — Fentanyl Citrate Inj 0.05 MG/ML: INTRAMUSCULAR | Qty: 2 | Status: AC

## 2011-05-16 ENCOUNTER — Other Ambulatory Visit: Payer: Self-pay | Admitting: Thoracic Surgery (Cardiothoracic Vascular Surgery)

## 2011-05-16 ENCOUNTER — Institutional Professional Consult (permissible substitution) (INDEPENDENT_AMBULATORY_CARE_PROVIDER_SITE_OTHER): Payer: 59 | Admitting: Thoracic Surgery (Cardiothoracic Vascular Surgery)

## 2011-05-16 ENCOUNTER — Other Ambulatory Visit: Payer: Self-pay

## 2011-05-16 ENCOUNTER — Encounter: Payer: Self-pay | Admitting: Thoracic Surgery (Cardiothoracic Vascular Surgery)

## 2011-05-16 VITALS — BP 117/64 | HR 80 | Resp 20 | Ht 67.0 in | Wt 169.0 lb

## 2011-05-16 DIAGNOSIS — I2510 Atherosclerotic heart disease of native coronary artery without angina pectoris: Secondary | ICD-10-CM

## 2011-05-16 DIAGNOSIS — Z720 Tobacco use: Secondary | ICD-10-CM

## 2011-05-16 DIAGNOSIS — I739 Peripheral vascular disease, unspecified: Secondary | ICD-10-CM

## 2011-05-16 DIAGNOSIS — I10 Essential (primary) hypertension: Secondary | ICD-10-CM

## 2011-05-16 DIAGNOSIS — I779 Disorder of arteries and arterioles, unspecified: Secondary | ICD-10-CM

## 2011-05-16 DIAGNOSIS — E119 Type 2 diabetes mellitus without complications: Secondary | ICD-10-CM

## 2011-05-16 DIAGNOSIS — F17200 Nicotine dependence, unspecified, uncomplicated: Secondary | ICD-10-CM

## 2011-05-16 NOTE — Progress Notes (Signed)
PCP is Pearson Grippe, MD, MD Referring Provider is [name], MD  Chief Complaint  Patient presents with  . 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 W/u began when he was being assessed for left rotator cuff repair. Patient is unclear on the exact sequence of events, but he was not cleared from a cardiac perspective. He was found to have severe PAD with ABI of 0.52 on R and 0.47 on L. He had a stress test on 03/18/11 which showed mild global hypokinesis with an EF of 48%. Felt to have balanced ischemia. Cath on 04/12/11 showed total RCA, 90% circumflex. Attempted PTCA of RCA unsuccessful. Recath with FFR of LAD showed the LAD stenosis to hemodynamically significant. He has not had angina, but have frequent indigestion in the mornings relieved with belching. His exercise is limited by bilateral claudication.   Past Medical History  Diagnosis Date  . Hypertension     under control; has been on med. x 1 yr.  . Arthritis     shoulder and knee  . Diabetes mellitus     IDDM  . Hyperlipemia   . Rotator cuff tear, left     AC joint DJD  . Sleep apnea     no CPAP - did not return for follow-up after sleep study  . Peripheral vascular disease     blockages in legs  . Abnormal cardiovascular stress test     Past Surgical History  Procedure Date  . Knee surgery 1980s or 1990s  . Tonsillectomy as a child    No family history on file.  Social History History  Substance Use Topics  . Smoking status: Current Everyday Smoker -- 2.0 packs/day for 40 years    Types: Cigarettes  . Smokeless tobacco: Never Used  . Alcohol Use: Yes     3 x/wk.    Current Outpatient Prescriptions  Medication Sig Dispense Refill  . atorvastatin (LIPITOR) 40 MG tablet Take 40 mg by  mouth daily. PM       . Cholecalciferol (VITAMIN D-3) 5000 UNITS TABS Take 1 tablet by mouth daily.        . cilostazol (PLETAL) 100 MG tablet Take 100 mg by mouth 2 (two) times daily.        . insulin glargine (LANTUS) 100 UNIT/ML injection Inject 25-30 Units into the skin at bedtime.       Marland Kitchen KRILL OIL PO Take 2 capsules by mouth daily.       Marland Kitchen l-methylfolate-B6-B12 (METANX) 3-35-2 MG TABS Take 1 tablet by mouth 2 (two) times daily.        . meloxicam (MOBIC) 15 MG tablet Take 15 mg by mouth daily as needed. For muscle spasm.      . propranolol (INDERAL) 40 MG tablet Take 40 mg by mouth 3 (three) times daily.        . ramipril (ALTACE) 5 MG capsule Take 5 mg by mouth daily.        . sitaGLIPtin (JANUVIA) 100 MG tablet Take 100 mg by mouth daily.        No Known Allergies  Review of Systems  HENT: Positive for hearing loss.   Respiratory: Positive for wheezing.   Cardiovascular:       Claudication  Gastrointestinal:       Reflux   Genitourinary: Negative.   Musculoskeletal: Positive for myalgias, joint swelling and arthralgias.       Left rotator cuff tear- awaiting surgery  Neurological: Negative for dizziness, facial asymmetry, speech difficulty, weakness, light-headedness, numbness and headaches.  All other systems reviewed and are negative.    BP 117/64  Pulse 80  Resp 20  Ht 5\' 7"  (1.702 m)  Wt 169 lb (76.658 kg)  BMI 26.47 kg/m2  SpO2 94% Physical Exam  Constitutional: He is oriented to person, place, and time. He appears well-developed and well-nourished. No distress.  HENT:  Head: Normocephalic and atraumatic.  Eyes: EOM are normal. Pupils are equal, round, and reactive to light.  Neck: Neck supple. No JVD present.       +left carotid bruit  Cardiovascular: Normal rate, regular rhythm and normal heart sounds.        Absent DP/PT bilaterally Abnormal Allen's test left arm  Pulmonary/Chest: Breath sounds normal. No respiratory distress. He has no wheezes. He has  no rales.  Abdominal: Soft. There is no tenderness.  Musculoskeletal: He exhibits no edema.  Lymphadenopathy:    He has no cervical adenopathy.  Neurological: He is alert and oriented to person, place, and time. No cranial nerve deficit (no focal deficit).       No focal deficit   Skin: Skin is warm and dry.  Psychiatric: He has a normal mood and affect.     Diagnostic Tests: Stress test/ cardiac catheterizations reviewed.  Impression: 61 yo WM with multiple CRF and a positive stress test during a preoperative evaluation. He has 3 vessel CAD, and probably left main disease(calcified, small caliber) as well, with FFR evidence of significant impairment to flow in the LAD distribution. CABG is indicated for survival benefit. It is unclear if his indigestion/ belching is related to his heart or is GI in nature.   I had a long discussion with the patient and his wife and reviewed the most recent cath films with them. I discussed the indications, risks, benefits and alternative treatments with them. They understand the reasoning for the recommendation of CABG.   I have discussed with them the general nature of the procedure, need for general anesthesia,and incisions to be used. I discussed the expected hospital stay, overall recovery and short and long term outcomes. They understand the risks include but are not limited to death, stroke, MI, DVT/PE, bleeding, possible need for transfusion, infections, other organ system dysfunction including respiratory, renal, or GI complications. They understand and accept these risks and agree to proceed. They understand he is at increased, but not prohibitive, risk due to DM and PAD.  He has a left carotid bruit which needs to be evaluated. He also is a heavy smoker and needs preoperative PFTs.  He is not a candidate for bilateral IMA due to IDDM and we can't use left radial artery due to his markedly abnormal Allen's test on the left.  Plan: CABG on Monday  3/11

## 2011-05-17 ENCOUNTER — Encounter (HOSPITAL_COMMUNITY): Payer: Self-pay | Admitting: Pharmacy Technician

## 2011-05-19 ENCOUNTER — Other Ambulatory Visit (HOSPITAL_COMMUNITY): Payer: Self-pay | Admitting: Radiology

## 2011-05-19 ENCOUNTER — Other Ambulatory Visit: Payer: Self-pay

## 2011-05-19 ENCOUNTER — Encounter (HOSPITAL_COMMUNITY)
Admission: RE | Admit: 2011-05-19 | Discharge: 2011-05-19 | Disposition: A | Discharge status: Home / Self Care | Payer: 59 | Source: Ambulatory Visit | Attending: Thoracic Surgery (Cardiothoracic Vascular Surgery) | Admitting: Thoracic Surgery (Cardiothoracic Vascular Surgery)

## 2011-05-19 ENCOUNTER — Encounter (HOSPITAL_COMMUNITY): Payer: Self-pay | Admitting: Pharmacy Technician

## 2011-05-19 ENCOUNTER — Encounter (HOSPITAL_COMMUNITY): Payer: Self-pay

## 2011-05-19 ENCOUNTER — Ambulatory Visit (HOSPITAL_COMMUNITY)
Admission: RE | Admit: 2011-05-19 | Discharge: 2011-05-19 | Disposition: A | Discharge status: Home / Self Care | Payer: 59 | Source: Ambulatory Visit | Attending: Thoracic Surgery (Cardiothoracic Vascular Surgery) | Admitting: Thoracic Surgery (Cardiothoracic Vascular Surgery)

## 2011-05-19 ENCOUNTER — Inpatient Hospital Stay (HOSPITAL_COMMUNITY)
Admission: RE | Admit: 2011-05-19 | Discharge: 2011-05-19 | Disposition: A | Discharge status: Home / Self Care | Payer: 59 | Source: Ambulatory Visit | Attending: Thoracic Surgery (Cardiothoracic Vascular Surgery) | Admitting: Thoracic Surgery (Cardiothoracic Vascular Surgery)

## 2011-05-19 DIAGNOSIS — I2510 Atherosclerotic heart disease of native coronary artery without angina pectoris: Secondary | ICD-10-CM

## 2011-05-19 DIAGNOSIS — Z01811 Encounter for preprocedural respiratory examination: Secondary | ICD-10-CM | POA: Insufficient documentation

## 2011-05-19 DIAGNOSIS — Z01812 Encounter for preprocedural laboratory examination: Secondary | ICD-10-CM | POA: Insufficient documentation

## 2011-05-19 DIAGNOSIS — Z01810 Encounter for preprocedural cardiovascular examination: Secondary | ICD-10-CM | POA: Insufficient documentation

## 2011-05-19 DIAGNOSIS — J438 Other emphysema: Secondary | ICD-10-CM | POA: Insufficient documentation

## 2011-05-19 HISTORY — DX: Respiratory tuberculosis unspecified: A15.9

## 2011-05-19 HISTORY — DX: Gastro-esophageal reflux disease without esophagitis: K21.9

## 2011-05-19 LAB — PROTIME-INR
INR: 1.05 (ref 0.00–1.49)
Prothrombin Time: 13.9 seconds (ref 11.6–15.2)

## 2011-05-19 LAB — URINALYSIS, ROUTINE W REFLEX MICROSCOPIC
Bilirubin Urine: NEGATIVE
Ketones, ur: NEGATIVE mg/dL
Nitrite: NEGATIVE
Protein, ur: NEGATIVE mg/dL
pH: 6 (ref 5.0–8.0)

## 2011-05-19 LAB — COMPREHENSIVE METABOLIC PANEL
ALT: 16 U/L (ref 0–53)
AST: 15 U/L (ref 0–37)
Albumin: 3.2 g/dL — ABNORMAL LOW (ref 3.5–5.2)
Calcium: 9 mg/dL (ref 8.4–10.5)
Potassium: 4 mEq/L (ref 3.5–5.1)
Sodium: 137 mEq/L (ref 135–145)
Total Protein: 6.5 g/dL (ref 6.0–8.3)

## 2011-05-19 LAB — ABO/RH: ABO/RH(D): O POS

## 2011-05-19 LAB — CBC
MCH: 33 pg (ref 26.0–34.0)
MCHC: 34.7 g/dL (ref 30.0–36.0)
Platelets: 221 10*3/uL (ref 150–400)

## 2011-05-19 LAB — APTT: aPTT: 39 seconds — ABNORMAL HIGH (ref 24–37)

## 2011-05-19 LAB — TYPE AND SCREEN
ABO/RH(D): O POS
Antibody Screen: NEGATIVE

## 2011-05-19 MED ORDER — ALBUTEROL SULFATE (5 MG/ML) 0.5% IN NEBU
2.5000 mg | INHALATION_SOLUTION | Freq: Once | RESPIRATORY_TRACT | Status: AC
  Administered 2011-05-19: 2.5 mg via RESPIRATORY_TRACT

## 2011-05-19 NOTE — Pre-Procedure Instructions (Signed)
20 OBED SAMEK  05/19/2011   Your procedure is scheduled on:    Monday  05/23/11  Report to [facility] at 530 AM.  Call this number if you have problems the morning of surgery: [phone]   Remember:   Do not eat food:After Midnight.  May have clear liquids: up to 4 Hours before arrival.  Clear liquids include soda, tea, black coffee, apple or grape juice, broth.  Take these medicines the morning of surgery with A SIP OF WATER:  PROPANOLOL(INDERAL)    Do not wear jewelry, make-up or nail polish.  Do not wear lotions, powders, or perfumes. You may wear deodorant.  Do not shave 48 hours prior to surgery.  Do not bring valuables to the hospital.  Contacts, dentures or bridgework may not be worn into surgery.  Leave suitcase in the car. After surgery it may be brought to your room.  For patients admitted to the hospital, checkout time is 11:00 AM the day of discharge.   Patients discharged the day of surgery will not be allowed to drive home.  Name and phone number of your driver:   Special Instructions: CHG Shower Use Special Wash: 1/2 bottle night before surgery and 1/2 bottle morning of surgery.   Please read over the following fact sheets that you were given: Pain Booklet, Open Heart Packet, MRSA Information and Surgical Site Infection Prevention

## 2011-05-19 NOTE — Progress Notes (Signed)
Pre-op Cardiac Surgery  Carotid Findings:  Bilaterally no significant ICA stenosis with antegrade vertebral flow.  Upper Extremity Right Left  Brachial Pressures 120 124  Radial Waveforms Tri Tri  Ulnar Waveforms Tri Tri  Palmar Arch (Allen's Test) Waveform remains normal with radial compression and obliterates with ulnar compression Waveform decreases >50% with radial compression and remains normal with ulnar compression    Lower  Extremity Right Left  Dorsalis Pedis 62, Mono 53, Dampened mono  Peroneal Not audible Not audible  Posterior Tibial Not audible Not audible  Ankle/Brachial Indices 0.5 0.43    Findings:  Consistent with severe disease bilaterally.   Farrel Demark , RDMS

## 2011-05-19 NOTE — Progress Notes (Signed)
SPOKE WITH SUSAN AT DR HENDRICKSONS OFFICE RE IF PATIENT NEEDS TO STOP PLETAL, SHE WILL CHECK WITH DR HENDRICKSON AND CONTACT PATIENT.

## 2011-05-22 MED ORDER — PHENYLEPHRINE HCL 10 MG/ML IJ SOLN
30.0000 ug/min | INTRAVENOUS | Status: AC
  Administered 2011-05-23: 15 ug/min via INTRAVENOUS
  Filled 2011-05-22: qty 2

## 2011-05-22 MED ORDER — MAGNESIUM SULFATE 50 % IJ SOLN
40.0000 meq | INTRAMUSCULAR | Status: DC
  Filled 2011-05-22: qty 10

## 2011-05-22 MED ORDER — SODIUM CHLORIDE 0.9 % IV SOLN
INTRAVENOUS | Status: AC
  Administered 2011-05-23: 2.8 [IU]/h via INTRAVENOUS
  Filled 2011-05-22: qty 1

## 2011-05-22 MED ORDER — EPINEPHRINE HCL 1 MG/ML IJ SOLN
0.5000 ug/min | INTRAVENOUS | Status: DC
  Filled 2011-05-22: qty 4

## 2011-05-22 MED ORDER — DEXTROSE 5 % IV SOLN
1.5000 g | INTRAVENOUS | Status: DC
  Filled 2011-05-22: qty 1.5

## 2011-05-22 MED ORDER — DOPAMINE-DEXTROSE 3.2-5 MG/ML-% IV SOLN
2.0000 ug/kg/min | INTRAVENOUS | Status: DC
  Filled 2011-05-22: qty 250

## 2011-05-22 MED ORDER — NITROGLYCERIN IN D5W 200-5 MCG/ML-% IV SOLN
2.0000 ug/min | INTRAVENOUS | Status: DC
  Filled 2011-05-22: qty 250

## 2011-05-22 MED ORDER — SODIUM CHLORIDE 0.9 % IV SOLN
1250.0000 mg | INTRAVENOUS | Status: AC
  Administered 2011-05-23: 1250 mg via INTRAVENOUS
  Filled 2011-05-22: qty 1250

## 2011-05-22 MED ORDER — DEXTROSE 5 % IV SOLN
750.0000 mg | INTRAVENOUS | Status: DC
  Filled 2011-05-22: qty 750

## 2011-05-22 MED ORDER — POTASSIUM CHLORIDE 2 MEQ/ML IV SOLN
80.0000 meq | INTRAVENOUS | Status: DC
  Filled 2011-05-22: qty 40

## 2011-05-22 MED ORDER — VERAPAMIL HCL 2.5 MG/ML IV SOLN
INTRAVENOUS | Status: AC
  Administered 2011-05-23: 10:00:00
  Filled 2011-05-22: qty 2.5

## 2011-05-22 MED ORDER — SODIUM CHLORIDE 0.9 % IV SOLN
0.1000 ug/kg/h | INTRAVENOUS | Status: AC
  Administered 2011-05-23: .2 ug/kg/h via INTRAVENOUS
  Filled 2011-05-22: qty 4

## 2011-05-22 MED ORDER — SODIUM CHLORIDE 0.9 % IV SOLN
INTRAVENOUS | Status: AC
  Administered 2011-05-23: 70 mL/h via INTRAVENOUS
  Filled 2011-05-22: qty 40

## 2011-05-23 ENCOUNTER — Encounter (HOSPITAL_COMMUNITY)
Admission: RE | Disposition: A | Discharge status: Home / Self Care | Payer: Self-pay | Source: Ambulatory Visit | Attending: Thoracic Surgery (Cardiothoracic Vascular Surgery)

## 2011-05-23 ENCOUNTER — Other Ambulatory Visit: Payer: Self-pay

## 2011-05-23 ENCOUNTER — Encounter (HOSPITAL_COMMUNITY): Payer: Self-pay | Admitting: Anesthesiology

## 2011-05-23 ENCOUNTER — Ambulatory Visit (HOSPITAL_COMMUNITY): Payer: 59 | Admitting: Anesthesiology

## 2011-05-23 ENCOUNTER — Inpatient Hospital Stay (HOSPITAL_COMMUNITY): Payer: 59

## 2011-05-23 ENCOUNTER — Inpatient Hospital Stay (HOSPITAL_COMMUNITY)
Admission: RE | Admit: 2011-05-23 | Discharge: 2011-05-30 | DRG: 236 | Disposition: A | Discharge status: Home / Self Care | Payer: 59 | Source: Ambulatory Visit | Attending: Thoracic Surgery (Cardiothoracic Vascular Surgery) | Admitting: Thoracic Surgery (Cardiothoracic Vascular Surgery)

## 2011-05-23 ENCOUNTER — Encounter (HOSPITAL_COMMUNITY): Payer: Self-pay | Admitting: *Deleted

## 2011-05-23 DIAGNOSIS — K5900 Constipation, unspecified: Secondary | ICD-10-CM | POA: Diagnosis not present

## 2011-05-23 DIAGNOSIS — I2510 Atherosclerotic heart disease of native coronary artery without angina pectoris: Principal | ICD-10-CM | POA: Diagnosis present

## 2011-05-23 DIAGNOSIS — Z7982 Long term (current) use of aspirin: Secondary | ICD-10-CM

## 2011-05-23 DIAGNOSIS — I70219 Atherosclerosis of native arteries of extremities with intermittent claudication, unspecified extremity: Secondary | ICD-10-CM | POA: Diagnosis present

## 2011-05-23 DIAGNOSIS — D62 Acute posthemorrhagic anemia: Secondary | ICD-10-CM | POA: Diagnosis not present

## 2011-05-23 DIAGNOSIS — E119 Type 2 diabetes mellitus without complications: Secondary | ICD-10-CM | POA: Diagnosis present

## 2011-05-23 DIAGNOSIS — F17200 Nicotine dependence, unspecified, uncomplicated: Secondary | ICD-10-CM | POA: Diagnosis present

## 2011-05-23 DIAGNOSIS — E876 Hypokalemia: Secondary | ICD-10-CM | POA: Diagnosis not present

## 2011-05-23 DIAGNOSIS — I10 Essential (primary) hypertension: Secondary | ICD-10-CM | POA: Diagnosis present

## 2011-05-23 DIAGNOSIS — Z794 Long term (current) use of insulin: Secondary | ICD-10-CM

## 2011-05-23 DIAGNOSIS — M1710 Unilateral primary osteoarthritis, unspecified knee: Secondary | ICD-10-CM | POA: Diagnosis present

## 2011-05-23 DIAGNOSIS — D696 Thrombocytopenia, unspecified: Secondary | ICD-10-CM | POA: Diagnosis not present

## 2011-05-23 DIAGNOSIS — M19019 Primary osteoarthritis, unspecified shoulder: Secondary | ICD-10-CM | POA: Diagnosis present

## 2011-05-23 DIAGNOSIS — K219 Gastro-esophageal reflux disease without esophagitis: Secondary | ICD-10-CM | POA: Diagnosis present

## 2011-05-23 DIAGNOSIS — Z79899 Other long term (current) drug therapy: Secondary | ICD-10-CM

## 2011-05-23 DIAGNOSIS — E785 Hyperlipidemia, unspecified: Secondary | ICD-10-CM | POA: Diagnosis present

## 2011-05-23 DIAGNOSIS — E8779 Other fluid overload: Secondary | ICD-10-CM | POA: Diagnosis not present

## 2011-05-23 DIAGNOSIS — Z951 Presence of aortocoronary bypass graft: Secondary | ICD-10-CM

## 2011-05-23 HISTORY — PX: CORONARY ARTERY BYPASS GRAFT: SHX141

## 2011-05-23 LAB — POCT I-STAT, CHEM 8
BUN: 8 mg/dL (ref 6–23)
Calcium, Ion: 1.04 mmol/L — ABNORMAL LOW (ref 1.12–1.32)
TCO2: 23 mmol/L (ref 0–100)

## 2011-05-23 LAB — CREATININE, SERUM
Creatinine, Ser: 0.62 mg/dL (ref 0.50–1.35)
GFR calc Af Amer: >90 mL/min (ref >90)
GFR calc non Af Amer: >90 mL/min (ref >90)

## 2011-05-23 LAB — POCT I-STAT 4, (NA,K, GLUC, HGB,HCT)
Glucose, Bld: 153 mg/dL — ABNORMAL HIGH (ref 70–99)
Glucose, Bld: 87 mg/dL (ref 70–99)
Glucose, Bld: 127 mg/dL — ABNORMAL HIGH (ref 70–99)
Glucose, Bld: 143 mg/dL — ABNORMAL HIGH (ref 70–99)
HCT: 39 % (ref 39.0–52.0)
HCT: 30 % — ABNORMAL LOW (ref 39.0–52.0)
Hemoglobin: 13.3 g/dL (ref 13.0–17.0)
Hemoglobin: 9.5 g/dL — ABNORMAL LOW (ref 13.0–17.0)
Hemoglobin: 9.5 g/dL — ABNORMAL LOW (ref 13.0–17.0)
Hemoglobin: 10.5 g/dL — ABNORMAL LOW (ref 13.0–17.0)
Potassium: 4.1 mEq/L (ref 3.5–5.1)
Potassium: 2.9 mEq/L — ABNORMAL LOW (ref 3.5–5.1)
Sodium: 139 mEq/L (ref 135–145)
Sodium: 138 mEq/L (ref 135–145)

## 2011-05-23 LAB — BLOOD GAS, ARTERIAL
Bicarbonate: 25.9 mEq/L — ABNORMAL HIGH (ref 20.0–24.0)
Patient temperature: 98.6
TCO2: 27.1 mmol/L (ref 0–100)
pCO2 arterial: 40.5 mmHg (ref 35.0–45.0)
pH, Arterial: 7.421 (ref 7.350–7.450)

## 2011-05-23 LAB — PLATELET COUNT: Platelets: 140 10*3/uL — ABNORMAL LOW (ref 150–400)

## 2011-05-23 LAB — CBC
HCT: 29.8 % — ABNORMAL LOW (ref 39.0–52.0)
Hemoglobin: 9.5 g/dL — ABNORMAL LOW (ref 13.0–17.0)
MCH: 33.1 pg (ref 26.0–34.0)
MCH: 33.2 pg (ref 26.0–34.0)
MCHC: 35.2 g/dL (ref 30.0–36.0)
MCHC: 34.7 g/dL (ref 30.0–36.0)
Platelets: 141 10*3/uL — ABNORMAL LOW (ref 150–400)
RDW: 13.6 % (ref 11.5–15.5)
RDW: 13.8 % (ref 11.5–15.5)

## 2011-05-23 LAB — POCT I-STAT 3, ART BLOOD GAS (G3+)
Acid-base deficit: 2 mmol/L (ref 0.0–2.0)
Acid-base deficit: 3 mmol/L — ABNORMAL HIGH (ref 0.0–2.0)
Bicarbonate: 23.9 mEq/L (ref 20.0–24.0)
Bicarbonate: 23.2 mEq/L (ref 20.0–24.0)
O2 Saturation: 100 %
Patient temperature: 35.5
Patient temperature: 37.5
Patient temperature: 37.9
TCO2: 26 mmol/L (ref 0–100)
TCO2: 24 mmol/L (ref 0–100)
pCO2 arterial: 46.5 mmHg — ABNORMAL HIGH (ref 35.0–45.0)
pCO2 arterial: 34.4 mmHg — ABNORMAL LOW (ref 35.0–45.0)
pH, Arterial: 7.432 (ref 7.350–7.450)
pH, Arterial: 7.333 — ABNORMAL LOW (ref 7.350–7.450)
pO2, Arterial: 328 mmHg — ABNORMAL HIGH (ref 80.0–100.0)
pO2, Arterial: 89 mmHg (ref 80.0–100.0)

## 2011-05-23 LAB — GLUCOSE, CAPILLARY
Glucose-Capillary: 175 mg/dL — ABNORMAL HIGH (ref 70–99)
Glucose-Capillary: 125 mg/dL — ABNORMAL HIGH (ref 70–99)
Glucose-Capillary: 143 mg/dL — ABNORMAL HIGH (ref 70–99)
Glucose-Capillary: 143 mg/dL — ABNORMAL HIGH (ref 70–99)

## 2011-05-23 LAB — MAGNESIUM: Magnesium: 2.8 mg/dL — ABNORMAL HIGH (ref 1.5–2.5)

## 2011-05-23 LAB — PROTIME-INR: INR: 1.33 (ref 0.00–1.49)

## 2011-05-23 LAB — HEMOGLOBIN AND HEMATOCRIT, BLOOD: HCT: 29.5 % — ABNORMAL LOW (ref 39.0–52.0)

## 2011-05-23 SURGERY — CORONARY ARTERY BYPASS GRAFTING (CABG)
Anesthesia: General | Site: Chest | Wound class: Clean

## 2011-05-23 MED ORDER — FENTANYL CITRATE 0.05 MG/ML IJ SOLN
INTRAMUSCULAR | Status: DC | PRN
  Administered 2011-05-23: 150 ug via INTRAVENOUS
  Administered 2011-05-23: 250 ug via INTRAVENOUS
  Administered 2011-05-23: 100 ug via INTRAVENOUS
  Administered 2011-05-23: 250 ug via INTRAVENOUS
  Administered 2011-05-23: 150 ug via INTRAVENOUS
  Administered 2011-05-23: 50 ug via INTRAVENOUS
  Administered 2011-05-23: 150 ug via INTRAVENOUS
  Administered 2011-05-23: 50 ug via INTRAVENOUS
  Administered 2011-05-23: 100 ug via INTRAVENOUS

## 2011-05-23 MED ORDER — PROTAMINE SULFATE 10 MG/ML IV SOLN
INTRAVENOUS | Status: DC | PRN
  Administered 2011-05-23: 25 mg via INTRAVENOUS

## 2011-05-23 MED ORDER — DEXTROSE 5 % IV SOLN
1.5000 g | Freq: Two times a day (BID) | INTRAVENOUS | Status: DC
  Administered 2011-05-23 – 2011-05-24 (×2): 1.5 g via INTRAVENOUS
  Filled 2011-05-23 (×3): qty 1.5

## 2011-05-23 MED ORDER — DOCUSATE SODIUM 100 MG PO CAPS
200.0000 mg | ORAL_CAPSULE | Freq: Every day | ORAL | Status: DC
  Administered 2011-05-24: 200 mg via ORAL
  Filled 2011-05-23: qty 2

## 2011-05-23 MED ORDER — ACETAMINOPHEN 650 MG RE SUPP
650.0000 mg | RECTAL | Status: AC
  Administered 2011-05-23: 650 mg via RECTAL

## 2011-05-23 MED ORDER — SODIUM CHLORIDE 0.9 % IV SOLN
0.1000 ug/kg/h | INTRAVENOUS | Status: DC
  Administered 2011-05-24: 0.1 ug/kg/h via INTRAVENOUS
  Filled 2011-05-23: qty 2

## 2011-05-23 MED ORDER — POTASSIUM CHLORIDE 10 MEQ/50ML IV SOLN
10.0000 meq | INTRAVENOUS | Status: AC
  Administered 2011-05-23 (×3): 10 meq via INTRAVENOUS

## 2011-05-23 MED ORDER — SODIUM CHLORIDE 0.9 % IJ SOLN: 3.0000 mL | INTRAMUSCULAR | Status: DC | PRN

## 2011-05-23 MED ORDER — BISACODYL 5 MG PO TBEC
10.0000 mg | DELAYED_RELEASE_TABLET | Freq: Every day | ORAL | Status: DC
  Administered 2011-05-24: 10 mg via ORAL
  Filled 2011-05-23: qty 2

## 2011-05-23 MED ORDER — MORPHINE SULFATE 2 MG/ML IJ SOLN
1.0000 mg | INTRAMUSCULAR | Status: DC | PRN
  Administered 2011-05-23 (×3): 2 mg via INTRAVENOUS
  Filled 2011-05-23 (×3): qty 1

## 2011-05-23 MED ORDER — HETASTARCH-ELECTROLYTES 6 % IV SOLN
INTRAVENOUS | Status: DC | PRN
  Administered 2011-05-23: 13:00:00 via INTRAVENOUS

## 2011-05-23 MED ORDER — LACTATED RINGERS IV SOLN
INTRAVENOUS | Status: DC | PRN
  Administered 2011-05-23: 07:00:00 via INTRAVENOUS

## 2011-05-23 MED ORDER — MAGNESIUM SULFATE 40 MG/ML IJ SOLN
4.0000 g | Freq: Once | INTRAMUSCULAR | Status: AC
  Administered 2011-05-23: 4 g via INTRAVENOUS
  Filled 2011-05-23: qty 100

## 2011-05-23 MED ORDER — ACETAMINOPHEN 160 MG/5ML PO SOLN
975.0000 mg | Freq: Four times a day (QID) | ORAL | Status: DC
  Filled 2011-05-23: qty 40.6

## 2011-05-23 MED ORDER — PANTOPRAZOLE SODIUM 40 MG PO TBEC
40.0000 mg | DELAYED_RELEASE_TABLET | Freq: Every day | ORAL | Status: DC
  Administered 2011-05-25 – 2011-05-30 (×6): 40 mg via ORAL
  Filled 2011-05-23 (×6): qty 1

## 2011-05-23 MED ORDER — LACTATED RINGERS IV SOLN
INTRAVENOUS | Status: DC
  Administered 2011-05-23: 14:00:00 via INTRAVENOUS

## 2011-05-23 MED ORDER — ASPIRIN 81 MG PO CHEW: 324.0000 mg | CHEWABLE_TABLET | Freq: Every day | ORAL | Status: DC

## 2011-05-23 MED ORDER — PROPRANOLOL HCL 40 MG PO TABS
40.0000 mg | ORAL_TABLET | Freq: Three times a day (TID) | ORAL | Status: DC
  Administered 2011-05-23: 40 mg via ORAL
  Filled 2011-05-23 (×3): qty 1

## 2011-05-23 MED ORDER — ASPIRIN EC 325 MG PO TBEC
325.0000 mg | DELAYED_RELEASE_TABLET | Freq: Every day | ORAL | Status: DC
  Administered 2011-05-24 – 2011-05-30 (×7): 325 mg via ORAL
  Filled 2011-05-23 (×7): qty 1

## 2011-05-23 MED ORDER — METOPROLOL TARTRATE 1 MG/ML IV SOLN: 2.5000 mg | INTRAVENOUS | Status: DC | PRN

## 2011-05-23 MED ORDER — METOPROLOL TARTRATE 12.5 MG HALF TABLET
12.5000 mg | ORAL_TABLET | Freq: Two times a day (BID) | ORAL | Status: DC
  Administered 2011-05-24: 12.5 mg via ORAL
  Filled 2011-05-23 (×5): qty 1

## 2011-05-23 MED ORDER — VANCOMYCIN HCL 1000 MG IV SOLR
1000.0000 mg | Freq: Once | INTRAVENOUS | Status: AC
  Administered 2011-05-23: 1000 mg via INTRAVENOUS
  Filled 2011-05-23: qty 1000

## 2011-05-23 MED ORDER — ACETAMINOPHEN 160 MG/5ML PO SOLN: 650.0000 mg | ORAL | Status: AC

## 2011-05-23 MED ORDER — METOPROLOL TARTRATE 12.5 MG HALF TABLET: 12.5000 mg | ORAL_TABLET | Freq: Once | ORAL | Status: DC

## 2011-05-23 MED ORDER — PROPOFOL 10 MG/ML IV EMUL
INTRAVENOUS | Status: DC | PRN
  Administered 2011-05-23: 100 mg via INTRAVENOUS

## 2011-05-23 MED ORDER — LIDOCAINE HCL (CARDIAC) 20 MG/ML IV SOLN
INTRAVENOUS | Status: DC | PRN
  Administered 2011-05-23: 50 mg via INTRAVENOUS

## 2011-05-23 MED ORDER — SODIUM CHLORIDE 0.9 % IV SOLN: 250.0000 mL | INTRAVENOUS | Status: DC

## 2011-05-23 MED ORDER — KETOROLAC TROMETHAMINE 30 MG/ML IJ SOLN
30.0000 mg | Freq: Four times a day (QID) | INTRAMUSCULAR | Status: AC
  Administered 2011-05-23 – 2011-05-24 (×4): 30 mg via INTRAVENOUS
  Filled 2011-05-23 (×4): qty 1

## 2011-05-23 MED ORDER — BISACODYL 10 MG RE SUPP: 10.0000 mg | Freq: Every day | RECTAL | Status: DC

## 2011-05-23 MED ORDER — CHLORHEXIDINE GLUCONATE 4 % EX LIQD: 30.0000 mL | CUTANEOUS | Status: DC

## 2011-05-23 MED ORDER — ROCURONIUM BROMIDE 100 MG/10ML IV SOLN
INTRAVENOUS | Status: DC | PRN
  Administered 2011-05-23: 100 mg via INTRAVENOUS
  Administered 2011-05-23: 50 mg via INTRAVENOUS

## 2011-05-23 MED ORDER — VECURONIUM BROMIDE 10 MG IV SOLR
INTRAVENOUS | Status: DC | PRN
  Administered 2011-05-23: 7 mg via INTRAVENOUS
  Administered 2011-05-23: 3 mg via INTRAVENOUS

## 2011-05-23 MED ORDER — SODIUM CHLORIDE 0.45 % IV SOLN
INTRAVENOUS | Status: DC
  Administered 2011-05-23: 20 mL via INTRAVENOUS

## 2011-05-23 MED ORDER — SODIUM CHLORIDE 0.9 % IJ SOLN: 3.0000 mL | Freq: Two times a day (BID) | INTRAMUSCULAR | Status: DC

## 2011-05-23 MED ORDER — ONDANSETRON HCL 4 MG/2ML IJ SOLN: 4.0000 mg | Freq: Four times a day (QID) | INTRAMUSCULAR | Status: DC | PRN

## 2011-05-23 MED ORDER — ALBUMIN HUMAN 5 % IV SOLN
250.0000 mL | INTRAVENOUS | Status: AC | PRN
  Administered 2011-05-23 – 2011-05-24 (×4): 250 mL via INTRAVENOUS
  Filled 2011-05-23 (×2): qty 250

## 2011-05-23 MED ORDER — OXYCODONE HCL 5 MG PO TABS
5.0000 mg | ORAL_TABLET | ORAL | Status: DC | PRN
  Administered 2011-05-24 – 2011-05-25 (×5): 10 mg via ORAL
  Administered 2011-05-26: 5 mg via ORAL
  Administered 2011-05-26: 10 mg via ORAL
  Administered 2011-05-26 – 2011-05-27 (×2): 5 mg via ORAL
  Filled 2011-05-23 (×2): qty 2
  Filled 2011-05-23 (×2): qty 1
  Filled 2011-05-23: qty 2
  Filled 2011-05-23: qty 1
  Filled 2011-05-23: qty 2
  Filled 2011-05-23: qty 1
  Filled 2011-05-23 (×2): qty 2

## 2011-05-23 MED ORDER — DOPAMINE-DEXTROSE 1.6-5 MG/ML-% IV SOLN
INTRAVENOUS | Status: DC | PRN
  Administered 2011-05-23: 3 ug/kg/min via INTRAVENOUS

## 2011-05-23 MED ORDER — ALBUMIN HUMAN 5 % IV SOLN
INTRAVENOUS | Status: DC | PRN
  Administered 2011-05-23: 12:00:00 via INTRAVENOUS

## 2011-05-23 MED ORDER — INSULIN REGULAR BOLUS VIA INFUSION
0.0000 [IU] | Freq: Three times a day (TID) | INTRAVENOUS | Status: DC
  Administered 2011-05-23: 0 [IU] via INTRAVENOUS
  Filled 2011-05-23: qty 10

## 2011-05-23 MED ORDER — PHENYLEPHRINE HCL 10 MG/ML IJ SOLN
0.0000 ug/min | INTRAVENOUS | Status: DC
  Administered 2011-05-24: 30 ug/min via INTRAVENOUS
  Filled 2011-05-23 (×2): qty 2

## 2011-05-23 MED ORDER — MORPHINE SULFATE 4 MG/ML IJ SOLN
2.0000 mg | INTRAMUSCULAR | Status: DC | PRN
  Administered 2011-05-23 – 2011-05-25 (×9): 4 mg via INTRAVENOUS
  Filled 2011-05-23 (×9): qty 1

## 2011-05-23 MED ORDER — MIDAZOLAM HCL 5 MG/5ML IJ SOLN
INTRAMUSCULAR | Status: DC | PRN
  Administered 2011-05-23 (×2): 2 mg via INTRAVENOUS
  Administered 2011-05-23: 1 mg via INTRAVENOUS
  Administered 2011-05-23: 4 mg via INTRAVENOUS
  Administered 2011-05-23 (×2): 2 mg via INTRAVENOUS

## 2011-05-23 MED ORDER — MIDAZOLAM HCL 2 MG/2ML IJ SOLN: 2.0000 mg | INTRAMUSCULAR | Status: DC | PRN

## 2011-05-23 MED ORDER — NITROGLYCERIN IN D5W 200-5 MCG/ML-% IV SOLN
0.0000 ug/min | INTRAVENOUS | Status: DC
  Administered 2011-05-23: 0 ug/min via INTRAVENOUS

## 2011-05-23 MED ORDER — SODIUM CHLORIDE 0.9 % IJ SOLN
OROMUCOSAL | Status: DC | PRN
  Administered 2011-05-23: 10:00:00 via TOPICAL

## 2011-05-23 MED ORDER — SODIUM CHLORIDE 0.9 % IV SOLN
INTRAVENOUS | Status: DC
  Administered 2011-05-23: 1.7 [IU]/h via INTRAVENOUS
  Administered 2011-05-23: 2.5 [IU]/h via INTRAVENOUS
  Filled 2011-05-23: qty 1

## 2011-05-23 MED ORDER — SODIUM CHLORIDE 0.9 % IV SOLN: INTRAVENOUS | Status: DC

## 2011-05-23 MED ORDER — ATORVASTATIN CALCIUM 40 MG PO TABS
40.0000 mg | ORAL_TABLET | Freq: Every day | ORAL | Status: DC
  Filled 2011-05-23: qty 1

## 2011-05-23 MED ORDER — METOPROLOL TARTRATE 25 MG/10 ML ORAL SUSPENSION
12.5000 mg | Freq: Two times a day (BID) | ORAL | Status: DC
  Filled 2011-05-23 (×5): qty 5

## 2011-05-23 MED ORDER — 0.9 % SODIUM CHLORIDE (POUR BTL) OPTIME
TOPICAL | Status: DC | PRN
  Administered 2011-05-23: 6000 mL

## 2011-05-23 MED ORDER — PROTAMINE SULFATE 10 MG/ML IV SOLN
25.0000 mg | Freq: Once | INTRAVENOUS | Status: AC
  Administered 2011-05-23: 25 mg via INTRAVENOUS

## 2011-05-23 MED ORDER — HEMOSTATIC AGENTS (NO CHARGE) OPTIME
TOPICAL | Status: DC | PRN
  Administered 2011-05-23: 1 via TOPICAL

## 2011-05-23 MED ORDER — ACETAMINOPHEN 500 MG PO TABS
1000.0000 mg | ORAL_TABLET | Freq: Four times a day (QID) | ORAL | Status: DC
  Administered 2011-05-23 – 2011-05-24 (×4): 1000 mg via ORAL
  Filled 2011-05-23 (×8): qty 2

## 2011-05-23 MED ORDER — FAMOTIDINE IN NACL 20-0.9 MG/50ML-% IV SOLN
20.0000 mg | Freq: Two times a day (BID) | INTRAVENOUS | Status: DC
  Administered 2011-05-23: 20 mg via INTRAVENOUS

## 2011-05-23 MED ORDER — PROTAMINE SULFATE 10 MG/ML IV SOLN
INTRAVENOUS | Status: AC
  Filled 2011-05-23: qty 5

## 2011-05-23 MED ORDER — LACTATED RINGERS IV SOLN: 500.0000 mL | Freq: Once | INTRAVENOUS | Status: AC | PRN

## 2011-05-23 MED ORDER — HEPARIN SODIUM (PORCINE) 1000 UNIT/ML IJ SOLN
INTRAMUSCULAR | Status: DC | PRN
  Administered 2011-05-23: 2000 [IU] via INTRAVENOUS
  Administered 2011-05-23: 23000 [IU] via INTRAVENOUS

## 2011-05-23 MED ORDER — DOPAMINE-DEXTROSE 3.2-5 MG/ML-% IV SOLN: 0.0000 ug/kg/min | INTRAVENOUS | Status: DC

## 2011-05-23 SURGICAL SUPPLY — 101 items
ADAPTER CARDIO PERF ANTE/RETRO (ADAPTER) IMPLANT
APPLIER CLIP 9.375 SM OPEN (CLIP)
ATTRACTOMAT 16X20 MAGNETIC DRP (DRAPES) ×2 IMPLANT
BAG DECANTER FOR FLEXI CONT (MISCELLANEOUS) ×2 IMPLANT
BANDAGE ELASTIC 4 VELCRO ST LF (GAUZE/BANDAGES/DRESSINGS) ×2 IMPLANT
BANDAGE ELASTIC 6 VELCRO ST LF (GAUZE/BANDAGES/DRESSINGS) ×2 IMPLANT
BANDAGE GAUZE ELAST BULKY 4 IN (GAUZE/BANDAGES/DRESSINGS) ×2 IMPLANT
BASKET HEART (ORDER IN 25'S) (MISCELLANEOUS) ×1
BASKET HEART (ORDER IN 25S) (MISCELLANEOUS) ×1 IMPLANT
BLADE SAW STERNAL (BLADE) ×2 IMPLANT
BLADE SURG 11 STRL SS (BLADE) IMPLANT
CANISTER SUCTION 2500CC (MISCELLANEOUS) ×2 IMPLANT
CANN PRFSN .5XCNCT 15X34-48 (MISCELLANEOUS) ×1
CANNULA GUNDRY RCSP 15FR (MISCELLANEOUS) IMPLANT
CANNULA PRFSN .5XCNCT 15X34-48 (MISCELLANEOUS) ×1 IMPLANT
CANNULA VEN 2 STAGE (MISCELLANEOUS) ×1
CATH CPB KIT HENDRICKSON (MISCELLANEOUS) ×2 IMPLANT
CATH ROBINSON RED A/P 18FR (CATHETERS) ×4 IMPLANT
CATH THORACIC 36FR (CATHETERS) ×2 IMPLANT
CATH THORACIC 36FR RT ANG (CATHETERS) ×4 IMPLANT
CLIP APPLIE 9.375 SM OPEN (CLIP) IMPLANT
CLIP FOGARTY SPRING 6M (CLIP) ×4 IMPLANT
CLIP TI MEDIUM 24 (CLIP) IMPLANT
CLIP TI WIDE RED SMALL 24 (CLIP) ×4 IMPLANT
CLOTH BEACON ORANGE TIMEOUT ST (SAFETY) ×2 IMPLANT
CONN Y 3/8X3/8X3/8  BEN (MISCELLANEOUS)
CONN Y 3/8X3/8X3/8 BEN (MISCELLANEOUS) IMPLANT
COVER MAYO STAND STRL (DRAPES) ×2 IMPLANT
COVER SURGICAL LIGHT HANDLE (MISCELLANEOUS) ×4 IMPLANT
CRADLE DONUT ADULT HEAD (MISCELLANEOUS) ×2 IMPLANT
DERMABOND ADVANCED (GAUZE/BANDAGES/DRESSINGS) ×1
DERMABOND ADVANCED .7 DNX12 (GAUZE/BANDAGES/DRESSINGS) ×1 IMPLANT
DRAPE CARDIOVASCULAR INCISE (DRAPES) ×1
DRAPE SLUSH/WARMER DISC (DRAPES) IMPLANT
DRAPE SRG 135X102X78XABS (DRAPES) ×1 IMPLANT
DRSG COVADERM 4X14 (GAUZE/BANDAGES/DRESSINGS) ×2 IMPLANT
ELECT PAD GROUND ADT 9 (MISCELLANEOUS) IMPLANT
ELECT REM PT RETURN 9FT ADLT (ELECTROSURGICAL) ×4
ELECTRODE REM PT RTRN 9FT ADLT (ELECTROSURGICAL) ×2 IMPLANT
GLOVE BIO SURGEON STRL SZ 6 (GLOVE) ×2 IMPLANT
GLOVE BIO SURGEON STRL SZ 6.5 (GLOVE) ×12 IMPLANT
GLOVE BIO SURGEON STRL SZ7.5 (GLOVE) ×6 IMPLANT
GLOVE BIOGEL PI IND STRL 6.5 (GLOVE) ×7 IMPLANT
GLOVE BIOGEL PI IND STRL 7.0 (GLOVE) ×3 IMPLANT
GLOVE BIOGEL PI INDICATOR 6.5 (GLOVE) ×7
GLOVE BIOGEL PI INDICATOR 7.0 (GLOVE) ×3
GLOVE EUDERMIC 7 POWDERFREE (GLOVE) ×8 IMPLANT
GOWN PREVENTION PLUS XLARGE (GOWN DISPOSABLE) ×6 IMPLANT
GOWN STRL NON-REIN LRG LVL3 (GOWN DISPOSABLE) ×18 IMPLANT
HEMOSTAT POWDER SURGIFOAM 1G (HEMOSTASIS) ×6 IMPLANT
HEMOSTAT SURGICEL 2X14 (HEMOSTASIS) ×4 IMPLANT
INSERT FOGARTY XLG (MISCELLANEOUS) IMPLANT
KIT BASIN OR (CUSTOM PROCEDURE TRAY) ×2 IMPLANT
KIT PAIN CUSTOM (MISCELLANEOUS) IMPLANT
KIT ROOM TURNOVER OR (KITS) ×2 IMPLANT
KIT SUCTION CATH 14FR (SUCTIONS) ×4 IMPLANT
KIT VASOVIEW 6 PRO VH 2400 (KITS) ×2 IMPLANT
LINE VENT (MISCELLANEOUS) ×2 IMPLANT
MARKER GRAFT CORONARY BYPASS (MISCELLANEOUS) ×6 IMPLANT
NS IRRIG 1000ML POUR BTL (IV SOLUTION) ×12 IMPLANT
PACK OPEN HEART (CUSTOM PROCEDURE TRAY) ×2 IMPLANT
PAD ARMBOARD 7.5X6 YLW CONV (MISCELLANEOUS) ×4 IMPLANT
PENCIL BUTTON HOLSTER BLD 10FT (ELECTRODE) ×2 IMPLANT
PUNCH AORTIC ROTATE 4.0MM (MISCELLANEOUS) IMPLANT
PUNCH AORTIC ROTATE 4.5MM 8IN (MISCELLANEOUS) ×2 IMPLANT
PUNCH AORTIC ROTATE 5MM 8IN (MISCELLANEOUS) IMPLANT
SET CARDIOPLEGIA MPS 5001102 (MISCELLANEOUS) ×2 IMPLANT
SPONGE GAUZE 4X4 12PLY (GAUZE/BANDAGES/DRESSINGS) ×4 IMPLANT
SPONGE LAP 18X18 X RAY DECT (DISPOSABLE) ×2 IMPLANT
SUT CHROMIC 6 0 CE20/HE6 (SUTURE) ×4 IMPLANT
SUT MNCRL AB 4-0 PS2 18 (SUTURE) IMPLANT
SUT PROLENE 3 0 SH DA (SUTURE) ×2 IMPLANT
SUT PROLENE 4 0 RB 1 (SUTURE)
SUT PROLENE 4 0 SH DA (SUTURE) IMPLANT
SUT PROLENE 4-0 RB1 .5 CRCL 36 (SUTURE) IMPLANT
SUT PROLENE 6 0 C 1 30 (SUTURE) ×10 IMPLANT
SUT PROLENE 7 0 BV1 MDA (SUTURE) ×4 IMPLANT
SUT PROLENE 8 0 BV175 6 (SUTURE) ×2 IMPLANT
SUT SILK  1 MH (SUTURE) ×1
SUT SILK 1 MH (SUTURE) ×1 IMPLANT
SUT STEEL 6MS V (SUTURE) IMPLANT
SUT STEEL STERNAL CCS#1 18IN (SUTURE) ×4 IMPLANT
SUT STEEL SZ 6 DBL 3X14 BALL (SUTURE) ×2 IMPLANT
SUT VIC AB 1 CTX 36 (SUTURE) ×2
SUT VIC AB 1 CTX36XBRD ANBCTR (SUTURE) ×2 IMPLANT
SUT VIC AB 2-0 CT1 27 (SUTURE)
SUT VIC AB 2-0 CT1 TAPERPNT 27 (SUTURE) IMPLANT
SUT VIC AB 2-0 CTX 27 (SUTURE) IMPLANT
SUT VIC AB 3-0 SH 27 (SUTURE)
SUT VIC AB 3-0 SH 27X BRD (SUTURE) IMPLANT
SUT VIC AB 3-0 X1 27 (SUTURE) IMPLANT
SUT VICRYL 4-0 PS2 18IN ABS (SUTURE) IMPLANT
SUTURE E-PAK OPEN HEART (SUTURE) ×2 IMPLANT
SYSTEM SAHARA CHEST DRAIN ATS (WOUND CARE) ×2 IMPLANT
TAPE CLOTH SURG 4X10 WHT LF (GAUZE/BANDAGES/DRESSINGS) ×2 IMPLANT
TOWEL OR 17X24 6PK STRL BLUE (TOWEL DISPOSABLE) ×4 IMPLANT
TOWEL OR 17X26 10 PK STRL BLUE (TOWEL DISPOSABLE) ×6 IMPLANT
TRAY FOLEY IC TEMP SENS 14FR (CATHETERS) ×2 IMPLANT
TUBING INSUFFLATION 10FT LAP (TUBING) ×2 IMPLANT
UNDERPAD 30X30 INCONTINENT (UNDERPADS AND DIAPERS) ×2 IMPLANT
WATER STERILE IRR 1000ML POUR (IV SOLUTION) ×4 IMPLANT

## 2011-05-23 NOTE — Transfer of Care (Signed)
Immediate Anesthesia Transfer of Care Note  Patient: Cory Clarke  Procedure(s) Performed: Procedure(s) (LRB): CORONARY ARTERY BYPASS GRAFTING (CABG) (N/A)  Patient Location: SICU  Anesthesia Type: General  Level of Consciousness: sedated  Airway & Oxygen Therapy: Patient remains intubated per anesthesia plan  Post-op Assessment: Report given to PACU RN and Post -op Vital signs reviewed and stable  Post vital signs: Reviewed and stable  Complications: No apparent anesthesia complications

## 2011-05-23 NOTE — Plan of Care (Signed)
Problem: Phase II Progression Outcomes Goal: Pain controlled with appropriate interventions Outcome: Progressing Pain is getting more under control, Dr. Dorris Fetch added Toradol Goal: Tolerates D/C of vasopressors Outcome: Progressing Pt remains on Neo gtt but titrating down Goal: Patient extubated within - Outcome: Completed/Met Date Met:  05/23/11 Within 6 hours    Goal: Emotional support given Outcome: Completed/Met Date Met:  05/23/11 To patient and wife    Goal: Other Phase II Outcomes/Goals Outcome: Not Progressing Pt remains on Insulin gtt

## 2011-05-23 NOTE — Progress Notes (Signed)
Pt. Didn't take beta blocker (inderal) at home. Ordered it from pharmacy and gave to pt. Metoprolol not given. Also ABG redrawn- lab states previous one was venus.

## 2011-05-23 NOTE — Brief Op Note (Addendum)
                   [address]            [place] [address]          [phone]    05/23/2011  12:09 PM  PATIENT:  Cory Clarke  61 y.o. male  PRE-OPERATIVE DIAGNOSIS:  Coronary Artery Disease  POST-OPERATIVE DIAGNOSIS:  Coronary Artery Disease  PROCEDURE:  Procedure(s): CORONARY ARTERY BYPASS GRAFTING (CABG)x5 (LIMA-LAD; SEQ SVG-RAMUS-OM1; SVG-DIAG1; SVG-PDA) EVH-RIGHT LEG  SURGEON:  Surgeon(s): Loreli Slot, MD  PHYSICIAN ASSISTANT: WAYNE GOLD PA-C  ANESTHESIA:   general  PATIENT CONDITION:  ICU - intubated and hemodynamically stable.  PRE-OPERATIVE WEIGHT: 75kg  COMPLICATIONS- NO KNOWN  XC 100 min CPB 131 min  Om1 fair target, remaining targets good quality

## 2011-05-23 NOTE — Procedures (Signed)
Extubation Procedure Note  Patient Details:   Name: KEYONTAY STOLZ DOB: [birth year] MRN: [id number]   Airway Documentation:     Evaluation  O2 sats: stable throughout and currently acceptable Complications: No apparent complications Patient  tolerate procedure well. Bilateral Breath Sounds: Clear   Yes Pt awake and alert, extubated per protocol, placed on 3L [place], sat 93%. Positive cuff leak, NIF-20, VC 1000, IS 650, BBS rh. Pt able to vocalize.  Arloa Koh 05/23/2011, 5:24 PM

## 2011-05-23 NOTE — H&P (Signed)
PCP is KIM, JAMES, MD, MD Referring Provider is Ganji, Jagadeesh R, MD  Chief Complaint  Patient presents with  . 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 W/u began when he was being assessed for left rotator cuff repair. Patient is unclear on the exact sequence of events, but he was not cleared from a cardiac perspective. He was found to have severe PAD with ABI of 0.52 on R and 0.47 on L. He had a stress test on 03/18/11 which showed mild global hypokinesis with an EF of 48%. Felt to have balanced ischemia. Cath on 04/12/11 showed total RCA, 90% circumflex. Attempted PTCA of RCA unsuccessful. Recath with FFR of LAD showed the LAD stenosis to hemodynamically significant. He has not had angina, but have frequent indigestion in the mornings relieved with belching. His exercise is limited by bilateral claudication.   Past Medical History  Diagnosis Date  . Hypertension     under control; has been on med. x 1 yr.  . Arthritis     shoulder and knee  . Diabetes mellitus     IDDM  . Hyperlipemia   . Rotator cuff tear, left     AC joint DJD  . Sleep apnea     no CPAP - did not return for follow-up after sleep study  . Peripheral vascular disease     blockages in legs  . Abnormal cardiovascular stress test     Past Surgical History  Procedure Date  . Knee surgery 1980s or 1990s  . Tonsillectomy as a child    No family history on file.  Social History History  Substance Use Topics  . Smoking status: Current Everyday Smoker -- 2.0 packs/day for 40 years    Types: Cigarettes  . Smokeless tobacco: Never Used  . Alcohol Use: Yes     3 x/wk.    Current Outpatient Prescriptions  Medication Sig Dispense Refill  . atorvastatin (LIPITOR) 40 MG tablet Take 40 mg by  mouth daily. PM       . Cholecalciferol (VITAMIN D-3) 5000 UNITS TABS Take 1 tablet by mouth daily.        . cilostazol (PLETAL) 100 MG tablet Take 100 mg by mouth 2 (two) times daily.        . insulin glargine (LANTUS) 100 UNIT/ML injection Inject 25-30 Units into the skin at bedtime.       . KRILL OIL PO Take 2 capsules by mouth daily.       . l-methylfolate-B6-B12 (METANX) 3-35-2 MG TABS Take 1 tablet by mouth 2 (two) times daily.        . meloxicam (MOBIC) 15 MG tablet Take 15 mg by mouth daily as needed. For muscle spasm.      . propranolol (INDERAL) 40 MG tablet Take 40 mg by mouth 3 (three) times daily.        . ramipril (ALTACE) 5 MG capsule Take 5 mg by mouth daily.        . sitaGLIPtin (JANUVIA) 100 MG tablet Take 100 mg by mouth daily.        No Known Allergies  Review of Systems  HENT: Positive for hearing loss.   Respiratory: Positive for wheezing.   Cardiovascular:       Claudication    Gastrointestinal:       Reflux   Genitourinary: Negative.   Musculoskeletal: Positive for myalgias, joint swelling and arthralgias.       Left rotator cuff tear- awaiting surgery  Neurological: Negative for dizziness, facial asymmetry, speech difficulty, weakness, light-headedness, numbness and headaches.  All other systems reviewed and are negative.    BP 117/64  Pulse 80  Resp 20  Ht 5' 7" (1.702 m)  Wt 169 lb (76.658 kg)  BMI 26.47 kg/m2  SpO2 94% Physical Exam  Constitutional: He is oriented to person, place, and time. He appears well-developed and well-nourished. No distress.  HENT:  Head: Normocephalic and atraumatic.  Eyes: EOM are normal. Pupils are equal, round, and reactive to light.  Neck: Neck supple. No JVD present.       +left carotid bruit  Cardiovascular: Normal rate, regular rhythm and normal heart sounds.        Absent DP/PT bilaterally Abnormal Allen's test left arm  Pulmonary/Chest: Breath sounds normal. No respiratory distress. He has no wheezes. He has  no rales.  Abdominal: Soft. There is no tenderness.  Musculoskeletal: He exhibits no edema.  Lymphadenopathy:    He has no cervical adenopathy.  Neurological: He is alert and oriented to person, place, and time. No cranial nerve deficit (no focal deficit).       No focal deficit   Skin: Skin is warm and dry.  Psychiatric: He has a normal mood and affect.     Diagnostic Tests: Stress test/ cardiac catheterizations reviewed.  Impression: 60 yo WM with multiple CRF and a positive stress test during a preoperative evaluation. He has 3 vessel CAD, and probably left main disease(calcified, small caliber) as well, with FFR evidence of significant impairment to flow in the LAD distribution. CABG is indicated for survival benefit. It is unclear if his indigestion/ belching is related to his heart or is GI in nature.   I had a long discussion with the patient and his wife and reviewed the most recent cath films with them. I discussed the indications, risks, benefits and alternative treatments with them. They understand the reasoning for the recommendation of CABG.   I have discussed with them the general nature of the procedure, need for general anesthesia,and incisions to be used. I discussed the expected hospital stay, overall recovery and short and long term outcomes. They understand the risks include but are not limited to death, stroke, MI, DVT/PE, bleeding, possible need for transfusion, infections, other organ system dysfunction including respiratory, renal, or GI complications. They understand and accept these risks and agree to proceed. They understand he is at increased, but not prohibitive, risk due to DM and PAD.  He has a left carotid bruit which needs to be evaluated. He also is a heavy smoker and needs preoperative PFTs.  He is not a candidate for bilateral IMA due to IDDM and we can't use left radial artery due to his markedly abnormal Allen's test on the left.  Plan: CABG on Monday  3/11 

## 2011-05-23 NOTE — Progress Notes (Signed)
Patient ID: CHADRIC KIMBERLEY, male   DOB: [birth year], 61 y.o.   MRN: [id number]  S/P cabg X 5  Extubated, c/o incisional pain  BP 113/57  Pulse 89  Temp(Src) 100.2 F (37.9 C) (Core (Comment))  Resp 19  Wt 166 lb (75.297 kg)  SpO2 96% Neuro intact  CT output high initially, down after protamine  K 4.9, Hct 30  Doing well  Will add toradol for pain control

## 2011-05-23 NOTE — Preoperative (Signed)
Beta Blockers   Reason not to administer Beta Blockers:pt given Inderal today @ 615

## 2011-05-23 NOTE — Interval H&P Note (Signed)
History and Physical Interval Note:  05/23/2011 7:37 AM  Cory Clarke  has presented today for surgery, with the diagnosis of CAD  The various methods of treatment have been discussed with the patient and family. After consideration of risks, benefits and other options for treatment, the patient has consented to  Procedure(s) (LRB): CORONARY ARTERY BYPASS GRAFTING (CABG) (N/A) as a surgical intervention .  The patients' history has been reviewed, patient examined, no change in status, stable for surgery.  I have reviewed the patients' chart and labs.  Questions were answered to the patient's satisfaction.     [name]

## 2011-05-23 NOTE — OR Nursing (Signed)
Unit 2300 notified at removal of retractor, skin closure and patient exit.

## 2011-05-23 NOTE — Anesthesia Preprocedure Evaluation (Addendum)
Anesthesia Evaluation  Patient identified by MRN, date of birth, ID band Patient awake, Patient confused and Patient unresponsive    Reviewed: Allergy & Precautions, H&P , NPO status , Patient's Chart, lab work & pertinent test results, reviewed documented beta blocker date and time   Airway Mallampati: I  Neck ROM: Full  Mouth opening: Limited Mouth Opening  Dental  (+) Teeth Intact and Dental Advisory Given   Pulmonary sleep apnea , Current Smoker,          Cardiovascular hypertension, Pt. on home beta blockers     Neuro/Psych negative neurological ROS  negative psych ROS   GI/Hepatic   Endo/Other  Diabetes mellitus-, Oral Hypoglycemic Agents and Insulin Dependent  Renal/GU   negative genitourinary   Musculoskeletal negative musculoskeletal ROS (+)   Abdominal   Peds negative pediatric ROS (+)  Hematology negative hematology ROS (+)   Anesthesia Other Findings   Reproductive/Obstetrics negative OB ROS                           Anesthesia Physical Anesthesia Plan  ASA: III  Anesthesia Plan: General   Post-op Pain Management:    Induction: Intravenous  Airway Management Planned:   Additional Equipment: Arterial line and PA Cath  Intra-op Plan:   Post-operative Plan:   Informed Consent: I have reviewed the patients History and Physical, chart, labs and discussed the procedure including the risks, benefits and alternatives for the proposed anesthesia with the patient or authorized representative who has indicated his/her understanding and acceptance.   Dental advisory given  Plan Discussed with: CRNA and Surgeon  Anesthesia Plan Comments:         Anesthesia Quick Evaluation

## 2011-05-24 ENCOUNTER — Inpatient Hospital Stay (HOSPITAL_COMMUNITY): Payer: 59

## 2011-05-24 LAB — GLUCOSE, CAPILLARY
Glucose-Capillary: 112 mg/dL — ABNORMAL HIGH (ref 70–99)
Glucose-Capillary: 116 mg/dL — ABNORMAL HIGH (ref 70–99)
Glucose-Capillary: 120 mg/dL — ABNORMAL HIGH (ref 70–99)
Glucose-Capillary: 158 mg/dL — ABNORMAL HIGH (ref 70–99)
Glucose-Capillary: 182 mg/dL — ABNORMAL HIGH (ref 70–99)
Glucose-Capillary: 233 mg/dL — ABNORMAL HIGH (ref 70–99)
Glucose-Capillary: 305 mg/dL — ABNORMAL HIGH (ref 70–99)
Glucose-Capillary: 90 mg/dL (ref 70–99)

## 2011-05-24 LAB — BASIC METABOLIC PANEL
CO2: 23 mEq/L (ref 19–32)
Chloride: 109 mEq/L (ref 96–112)
Creatinine, Ser: 0.65 mg/dL (ref 0.50–1.35)
GFR calc Af Amer: >90 mL/min (ref >90)
Potassium: 4 mEq/L (ref 3.5–5.1)
Sodium: 139 mEq/L (ref 135–145)

## 2011-05-24 LAB — CBC
MCV: 95.6 fL (ref 78.0–100.0)
Platelets: 122 10*3/uL — ABNORMAL LOW (ref 150–400)
RBC: 2.51 MIL/uL — ABNORMAL LOW (ref 4.22–5.81)
RDW: 13.9 % (ref 11.5–15.5)
WBC: 9.4 10*3/uL (ref 4.0–10.5)

## 2011-05-24 LAB — MAGNESIUM: Magnesium: 2.3 mg/dL (ref 1.5–2.5)

## 2011-05-24 MED ORDER — ENOXAPARIN SODIUM 40 MG/0.4ML ~~LOC~~ SOLN
40.0000 mg | Freq: Every day | SUBCUTANEOUS | Status: DC
  Administered 2011-05-24 – 2011-05-29 (×6): 40 mg via SUBCUTANEOUS
  Filled 2011-05-24 (×7): qty 0.4

## 2011-05-24 MED ORDER — SODIUM CHLORIDE 0.9 % IV SOLN
1.5000 g | INTRAVENOUS | Status: DC | PRN
  Administered 2011-05-23: 1.5 g via INTRAVENOUS

## 2011-05-24 MED ORDER — ALUM & MAG HYDROXIDE-SIMETH 200-200-20 MG/5ML PO SUSP: 15.0000 mL | ORAL | Status: DC | PRN

## 2011-05-24 MED ORDER — INSULIN ASPART 100 UNIT/ML ~~LOC~~ SOLN: 0.0000 [IU] | SUBCUTANEOUS | Status: DC

## 2011-05-24 MED ORDER — SODIUM CHLORIDE 0.9 % IJ SOLN: 3.0000 mL | INTRAMUSCULAR | Status: DC | PRN

## 2011-05-24 MED ORDER — INSULIN ASPART 100 UNIT/ML ~~LOC~~ SOLN
3.0000 [IU] | Freq: Three times a day (TID) | SUBCUTANEOUS | Status: DC
  Administered 2011-05-24 – 2011-05-30 (×19): 3 [IU] via SUBCUTANEOUS

## 2011-05-24 MED ORDER — INSULIN ASPART 100 UNIT/ML ~~LOC~~ SOLN
0.0000 [IU] | SUBCUTANEOUS | Status: DC
  Administered 2011-05-24: 8 [IU] via SUBCUTANEOUS
  Administered 2011-05-24: 11:00:00 via SUBCUTANEOUS
  Administered 2011-05-24: 4 [IU] via SUBCUTANEOUS
  Administered 2011-05-24: 2 [IU] via SUBCUTANEOUS
  Administered 2011-05-24: 16 [IU] via SUBCUTANEOUS
  Administered 2011-05-25 (×3): 4 [IU] via SUBCUTANEOUS

## 2011-05-24 MED ORDER — ONDANSETRON HCL 4 MG PO TABS: 4.0000 mg | ORAL_TABLET | Freq: Four times a day (QID) | ORAL | Status: DC | PRN

## 2011-05-24 MED ORDER — SODIUM CHLORIDE 0.9 % IV SOLN: 250.0000 mL | INTRAVENOUS | Status: DC | PRN

## 2011-05-24 MED ORDER — FUROSEMIDE 10 MG/ML IJ SOLN
20.0000 mg | Freq: Once | INTRAMUSCULAR | Status: AC
  Administered 2011-05-24: 20 mg via INTRAVENOUS
  Filled 2011-05-24: qty 2

## 2011-05-24 MED ORDER — POTASSIUM CHLORIDE 10 MEQ/50ML IV SOLN
10.0000 meq | INTRAVENOUS | Status: AC
  Administered 2011-05-24 (×3): 10 meq via INTRAVENOUS
  Filled 2011-05-24 (×2): qty 50

## 2011-05-24 MED ORDER — MAGNESIUM HYDROXIDE 400 MG/5ML PO SUSP
30.0000 mL | Freq: Every day | ORAL | Status: DC | PRN
  Administered 2011-05-27: 30 mL via ORAL
  Filled 2011-05-24: qty 30

## 2011-05-24 MED ORDER — IPRATROPIUM-ALBUTEROL 18-103 MCG/ACT IN AERO
2.0000 | INHALATION_SPRAY | Freq: Four times a day (QID) | RESPIRATORY_TRACT | Status: DC | PRN
  Filled 2011-05-24: qty 14.7

## 2011-05-24 MED ORDER — INSULIN GLARGINE 100 UNIT/ML ~~LOC~~ SOLN
25.0000 [IU] | Freq: Every day | SUBCUTANEOUS | Status: DC
  Administered 2011-05-24: 25 [IU] via SUBCUTANEOUS

## 2011-05-24 MED ORDER — INSULIN ASPART 100 UNIT/ML ~~LOC~~ SOLN
0.0000 [IU] | SUBCUTANEOUS | Status: DC
  Administered 2011-05-24 (×2): 2 [IU] via SUBCUTANEOUS

## 2011-05-24 MED ORDER — SODIUM CHLORIDE 0.9 % IJ SOLN
3.0000 mL | Freq: Two times a day (BID) | INTRAMUSCULAR | Status: DC
  Administered 2011-05-24 – 2011-05-29 (×11): 3 mL via INTRAVENOUS

## 2011-05-24 MED ORDER — CILOSTAZOL 100 MG PO TABS
100.0000 mg | ORAL_TABLET | Freq: Two times a day (BID) | ORAL | Status: DC
  Administered 2011-05-24 – 2011-05-30 (×13): 100 mg via ORAL
  Filled 2011-05-24 (×15): qty 1

## 2011-05-24 MED ORDER — INSULIN GLARGINE 100 UNIT/ML ~~LOC~~ SOLN
14.0000 [IU] | Freq: Once | SUBCUTANEOUS | Status: AC
  Administered 2011-05-24: 14 [IU] via SUBCUTANEOUS

## 2011-05-24 MED ORDER — LINAGLIPTIN 5 MG PO TABS
5.0000 mg | ORAL_TABLET | Freq: Every day | ORAL | Status: DC
  Administered 2011-05-24 – 2011-05-26 (×3): 5 mg via ORAL
  Filled 2011-05-24 (×3): qty 1

## 2011-05-24 MED ORDER — ALPRAZOLAM 0.25 MG PO TABS: 0.2500 mg | ORAL_TABLET | Freq: Four times a day (QID) | ORAL | Status: DC | PRN

## 2011-05-24 MED ORDER — ATORVASTATIN CALCIUM 40 MG PO TABS
40.0000 mg | ORAL_TABLET | Freq: Every day | ORAL | Status: DC
  Administered 2011-05-24 – 2011-05-29 (×6): 40 mg via ORAL
  Filled 2011-05-24 (×7): qty 1

## 2011-05-24 MED ORDER — ZOLPIDEM TARTRATE 5 MG PO TABS: 5.0000 mg | ORAL_TABLET | Freq: Every evening | ORAL | Status: DC | PRN

## 2011-05-24 MED ORDER — GUAIFENESIN-DM 100-10 MG/5ML PO SYRP
5.0000 mL | ORAL_SOLUTION | ORAL | Status: DC | PRN
  Administered 2011-05-25: 5 mL via ORAL
  Filled 2011-05-24: qty 5

## 2011-05-24 MED ORDER — FUROSEMIDE 40 MG PO TABS
40.0000 mg | ORAL_TABLET | Freq: Every day | ORAL | Status: DC
  Administered 2011-05-24 – 2011-05-30 (×7): 40 mg via ORAL
  Filled 2011-05-24 (×6): qty 1

## 2011-05-24 MED ORDER — HYDROCOD POLST-CHLORPHEN POLST 10-8 MG/5ML PO LQCR
5.0000 mL | Freq: Four times a day (QID) | ORAL | Status: DC | PRN
  Administered 2011-05-28: 5 mL via ORAL
  Filled 2011-05-24 (×2): qty 5

## 2011-05-24 MED ORDER — POTASSIUM CHLORIDE CRYS ER 20 MEQ PO TBCR
20.0000 meq | EXTENDED_RELEASE_TABLET | Freq: Two times a day (BID) | ORAL | Status: DC
  Filled 2011-05-24 (×2): qty 1

## 2011-05-24 MED ORDER — BENZONATATE 100 MG PO CAPS
100.0000 mg | ORAL_CAPSULE | Freq: Two times a day (BID) | ORAL | Status: DC | PRN
  Filled 2011-05-24: qty 1

## 2011-05-24 MED ORDER — MOVING RIGHT ALONG BOOK
Freq: Once | Status: AC
  Administered 2011-05-24: 13:00:00
  Filled 2011-05-24: qty 1

## 2011-05-24 MED FILL — Nitroglycerin IV Soln 5 MG/ML: INTRAVENOUS | Qty: 10 | Status: AC

## 2011-05-24 MED FILL — Verapamil HCl IV Soln 2.5 MG/ML: INTRAVENOUS | Qty: 4 | Status: AC

## 2011-05-24 MED FILL — Magnesium Sulfate Inj 50%: INTRAMUSCULAR | Qty: 10 | Status: AC

## 2011-05-24 MED FILL — Lactated Ringer's Solution: INTRAVENOUS | Qty: 500 | Status: AC

## 2011-05-24 MED FILL — Potassium Chloride Inj 2 mEq/ML: INTRAVENOUS | Qty: 40 | Status: AC

## 2011-05-24 MED FILL — Heparin Sodium (Porcine) Inj 1000 Unit/ML: INTRAMUSCULAR | Qty: 10 | Status: AC

## 2011-05-24 NOTE — Addendum Note (Signed)
Addendum  created 05/24/11 1317 by Eliud Polo A Yamen Castrogiovanni, CRNA   Modules edited:Anesthesia Events    

## 2011-05-24 NOTE — Op Note (Signed)
NAMEMAICO, MULVEHILL NO.:  [id number]  MEDICAL RECORD NO.:  [id number]  LOCATION:  2311                         FACILITY:  [facility]  PHYSICIAN:  Salvatore Decent. Dorris Fetch, M.D.DATE OF BIRTH:  06-23-2000  DATE OF PROCEDURE:  05/23/2011 DATE OF DISCHARGE:                              OPERATIVE REPORT   PREOPERATIVE DIAGNOSIS:  Left main and three-vessel coronary artery disease.  POSTOPERATIVE DIAGNOSIS:  Left main and three-vessel coronary artery disease.  PROCEDURE:  Median sternotomy, extracorporeal circulation, coronary artery bypass grafting x5 (left internal mammary artery to left anterior descending artery, saphenous vein graft to first diagonal, sequential saphenous vein graft to ramus intermedius and obtuse marginal 1, saphenous vein graft to posterior descending), endoscopic vein harvest, right leg.  SURGEON:  Salvatore Decent. Dorris Fetch, MD  ASSISTANT:  Rowe Clack, PA-C  ANESTHESIA:  General.  FINDINGS:  Good-quality conduits.  OM-1 1 mm fair-quality target. Remaining targets good quality.  CLINICAL INDICATION:  Mr. Karwowski is a 61 year old gentleman with multiple cardiac risk factors who had a positive stress test, which showed mild global hypokinesis and balanced ischemia.  At catheterization, he initially was found to have a totally occluded right coronary artery and disease in the circumflex. An attempted percutaneous intervention on the right coronary was unsuccessful.  Then repeat catheterization showed evidence of left main disease and FFR of the LAD showed the LAD stenosis to be hemodynamically significant.  He was therefore referred for coronary artery bypass grafting.  The indications, risks, benefits, and alternatives were discussed in detail with the patient.  He understood and accepted the risks and agreed to proceed.  OPERATIVE NOTE:  Mr. Steadham was brought to the preop holding area on May 23, 2011, there, the Anesthesia Service placed  a Swan-Ganz catheter and arterial blood pressure monitoring line and administered intravenous antibiotics.  The patient was taken to the operating room, anesthetized, and intubated.  A Foley catheter was placed.  The chest, abdomen, and legs were prepped and draped in usual sterile fashion.  An incision was made in the medial aspect of the right leg at the level of the knee.  The greater saphenous vein was identified and was harvested endoscopically from midcalf to groin.  Vein was good quality.  2000 units of heparin was administered during the vessel harvest. Simultaneously with the vein harvest, a median sternotomy was performed, then the left internal mammary artery was harvested using standard technique. After the conduits had been harvested, remainder of the full heparin dose given.  The pericardium was opened.  The ascending aorta was inspected, there was no evidence of atherosclerotic disease.  The aorta was cannulated via concentric 2-0 Ethibond pledgeted pursestring sutures.  A dual-stage venous cannula was placed via pursestring suture in the right atrial appendage.  Cardiopulmonary bypass was instituted and the patient was cooled to 32 degrees Celsius.  The coronary arteries were inspected and anastomotic sites were chosen.  The conduits were inspected and cut to length.  A foam pad was placed in the pericardium to protect the left phrenic nerve and insulate the heart.  A temperature probe was placed in the myocardial septum and a cardioplegic cannula was placed in the  ascending aorta.  The aorta was crossclamped.  The left ventricle was emptied via the aortic root vent.  Cardiac arrest then was achieved with combination of cold antegrade blood cardioplegia and topical iced saline.  After achieving a complete diastolic arrest and adequate myocardial septal cooling, the following distal anastomoses were performed.  First, a reversed saphenous vein graft was placed to the  posterior descending branch of the right coronary artery.  The right coronary artery was totally occluded.  The posterior descending was a 1.5-mm good- quality target, the vein was anastomosed end-to-side with a running 7-0 Prolene suture.  At the completion of each distal anastomosis, a probe was passed proximally and distally to ensure patency prior to tying the suture.  Cardioplegia then was administered at the completion of each vein graft to assess flow and hemostasis.  Next, a reversed saphenous vein graft was placed end-to-side to the first diagonal branch to the LAD.  This was a 1.5-mm good-quality target site of the anastomosis.  The vein graft was anastomosed end-to-side with a running 7-0 Prolene suture.  The vein was also of good quality.  Next, a reversed saphenous vein graft was placed sequentially to the ramus intermedius and first obtuse marginal.  Ramus intermedius was 1.8- mm good-quality target vessel, the OM-1 was a 1 mm fair-quality target vessel.  A side-to-side anastomosis was performed to the ramus and end- to-side to OM-1, both were done with running 7-0 Prolene sutures.  The probe was passed easily proximally and distally in both vessels.  There was good flow with cardioplegia administration and good hemostasis.  Next, the left internal mammary artery was brought through a window in the pericardium.  The distal end was beveled.  It was then anastomosed end-to-side to the LAD.  The LAD was a 2-mm good-quality target.  The mammary was a good-quality conduit.  The anastomosis was performed with a running 8-0 Prolene suture.  At the completion of the anastomosis, the bulldog clamps were removed from the mammary artery; however, septal rewarming was not observed.  The anastomosis was inspected and there was no clear reason for this; however, the bulldog clamp was replaced on the left mammary artery.  The anastomosis was taken down and the anastomosis was redone  again with a running 8-0 Prolene suture.  After redoing the anastomosis, the bulldog clamp was removed.  Immediate rapid septal rewarming was noted.  The bulldog clamp was again replaced and the mammary pedicle was tacked to the epicardial surface of the heart with 6- 0 Prolene sutures.  Additional cardioplegia was administered.  The vein grafts were cut to length.  The proximal vein graft anastomoses were performed to 4.5-mm punch aortotomies with running 6-0 Prolene sutures.  At the completion of final proximal anastomosis, the patient was placed in Trendelenburg position.  Lidocaine was administered.  The bulldog clamp was again removed from the left mammary artery.  The aortic root was de-aired and the aortic crossclamp was removed.  Total crossclamp time was 100 minutes.  The patient required a single defibrillation with 20 joules and thereafter was in sinus rhythm.  While rewarming was completed, all proximal and distal anastomoses were inspected for hemostasis. Epicardial pacing wires were placed on the right ventricle and right atrium.  When the patient was rewarmed to a core temperature of 37 degrees Celsius, he was weaned from cardiopulmonary bypass on the first attempt.  Total bypass time was 131 minute.  Initial cardiac index was 2 liters/minute/meter squared, and the patient  remained hemodynamically stable throughout the postbypass period.  A test dose of protamine was administered once well tolerated.  The atrial and aortic cannulae were removed.  The remainder of the protamine was administered without incident.  The chest was irrigated with warm saline.  Hemostasis was achieved.  The left pleural and single mediastinal chest tubes were placed through separate subcostal incisions.  The pericardium was reapproximated with interrupted 3-0 silk sutures, it came together easily without tension.  The sternum was closed with combination of single and double heavy gauge  stainless steel wires.  The pectoralis fascia, subcutaneous tissue, and skin were closed in standard fashion.  All sponge, needle, and instrument counts were correct at the end of procedure.  There were no intraoperative complications.  The patient was taken from the operating room to the surgical intensive care unit in good condition.     Salvatore Decent Dorris Fetch, M.D.     SCH/MEDQ  D:  05/23/2011  T:  05/24/2011  Job:  [id number]

## 2011-05-24 NOTE — Addendum Note (Signed)
Addendum  created 05/24/11 1317 by Adair Laundry, CRNA   Modules edited:Anesthesia Events

## 2011-05-24 NOTE — Progress Notes (Signed)
UR Completed.  Dallan Schonberg Jane [phone] 05/24/2011  

## 2011-05-24 NOTE — Anesthesia Postprocedure Evaluation (Signed)
  Anesthesia Post-op Note  Patient: Cory Clarke  Procedure(s) Performed: Procedure(s) (LRB): CORONARY ARTERY BYPASS GRAFTING (CABG) (N/A)  Patient Location: SICU  Anesthesia Type: General  Level of Consciousness: awake  Airway and Oxygen Therapy: Patient Spontanous Breathing  Post-op Pain: mild  Post-op Assessment: Post-op Vital signs reviewed and Patient's Cardiovascular Status Stable  Post-op Vital Signs: stable  Complications: No apparent anesthesia complications

## 2011-05-24 NOTE — Progress Notes (Signed)
Post foley removal 450cc urine output. Also, I called and Dr Donata Clay said to leave the patient Q4hr CBG.

## 2011-05-24 NOTE — Addendum Note (Signed)
Addendum  created 05/24/11 1345 by Patrick North, CRNA   Modules edited:Anesthesia Medication Administration, Charges VN

## 2011-05-24 NOTE — Progress Notes (Signed)
CARDIAC REHAB PHASE I   PRE:  Rate/Rhythm: 80 SR    BP: sitting 97/51, standing 112/58    SaO2: 94 2L  MODE:  Ambulation: 150 ft   POST:  Rate/Rhythm: 94 SR    BP: sitting 130/55     SaO2: 94 RA  Pt lightheaded upon standing. BP stable. Slow walk with RW, assist x2, and 2L. No major problems, just lightheaded. Return to bed. Will f/u. [phone]  Harriet Masson CES, ACSM

## 2011-05-24 NOTE — Progress Notes (Signed)
1 Day Post-Op Procedure(s) (LRB): CORONARY ARTERY BYPASS GRAFTING (CABG) (N/A) Subjective: Tired, unable to sleep secondary to frequent CBG checks overnight Pain well controlled  Objective: Vital signs in last 24 hours: Temp:  [95.5 F (35.3 C)-100.4 F (38 C)] 98.6 F (37 C) (03/12 0751) Pulse Rate:  [78-92] 78  (03/12 0700) Cardiac Rhythm:  [-] Normal sinus rhythm (03/12 0700) Resp:  [10-29] 15  (03/12 0700) BP: (81-131)/(48-66) 113/57 mmHg (03/11 1720) SpO2:  [87 %-100 %] 95 % (03/12 0700) Arterial Line BP: (73-140)/(43-68) 109/54 mmHg (03/12 0700) FiO2 (%):  [39.5 %-50.8 %] 40 % (03/11 1718) Weight:  [173 lb 15.1 oz (78.9 kg)] 173 lb 15.1 oz (78.9 kg) (03/12 0500)  Hemodynamic parameters for last 24 hours: PAP: (24-49)/(14-31) 33/18 mmHg CO:  [3.9 L/min-5.6 L/min] 3.9 L/min CI:  [2.1 L/min/m2-3 L/min/m2] 2.1 L/min/m2  Intake/Output from previous day: 03/11 0701 - 03/12 0700 In: 5753.1 [P.O.:460; I.V.:2413.1; Blood:350; NG/GT:30; IV Piggyback:2500] Out: 7280 [Urine:4860; Emesis/NG output:50; Blood:1500; Chest Tube:870] Intake/Output this shift:    General appearance: alert and no distress Neurologic: intact Heart: regular rate and rhythm Lungs: diminished breath sounds bibasilar  Lab Results:  Basename 05/24/11 0428 05/23/11 1901 05/23/11 1830  WBC 9.4 -- 13.6*  HGB 8.3* 10.5* --  HCT 24.0* 31.0* --  PLT 122* -- 141*   BMET:  Basename 05/24/11 0428 05/23/11 1901  NA 139 141  K 4.0 4.9  CL 109 112  CO2 23 --  GLUCOSE 132* 136*  BUN 9 8  CREATININE 0.65 0.70  CALCIUM 6.9* --    PT/INR:  Basename 05/23/11 1330  LABPROT 16.7*  INR 1.33   ABG    Component Value Date/Time   PHART 7.324* 05/23/2011 1856   HCO3 23.2 05/23/2011 1856   TCO2 23 05/23/2011 1901   ACIDBASEDEF 3.0* 05/23/2011 1856   O2SAT 92.0 05/23/2011 1856   CBG (last 3)   Basename 05/24/11 0753 05/24/11 0609 05/24/11 0405  GLUCAP 120* 126* 117*    Assessment/Plan: S/P Procedure(s)  (LRB): CORONARY ARTERY BYPASS GRAFTING (CABG) (N/A) - CV stable, d/c swan RESP- pulmonary toilet RENAL- creatinine OK, lasix this AM, hypokalemia improved Anemia acute secondary to ABL, follow DM- CBGs well controlled. Lantus, SSI, pharmacy substituting tradjenta for januvia D/c chest tubes Transfer to PTCU   LOS: 1 day    Cory Clarke C 05/24/2011

## 2011-05-24 NOTE — Addendum Note (Signed)
Addendum  created 05/24/11 1345 by Patrick North, CRNA   Modules edited:Anesthesia Medication Administration

## 2011-05-25 ENCOUNTER — Inpatient Hospital Stay (HOSPITAL_COMMUNITY): Payer: 59

## 2011-05-25 ENCOUNTER — Encounter (HOSPITAL_COMMUNITY): Payer: Self-pay | Admitting: Thoracic Surgery (Cardiothoracic Vascular Surgery)

## 2011-05-25 LAB — GLUCOSE, CAPILLARY
Glucose-Capillary: 201 mg/dL — ABNORMAL HIGH (ref 70–99)
Glucose-Capillary: 198 mg/dL — ABNORMAL HIGH (ref 70–99)
Glucose-Capillary: 192 mg/dL — ABNORMAL HIGH (ref 70–99)
Glucose-Capillary: 232 mg/dL — ABNORMAL HIGH (ref 70–99)
Glucose-Capillary: 192 mg/dL — ABNORMAL HIGH (ref 70–99)

## 2011-05-25 LAB — CBC
Hemoglobin: 8 g/dL — ABNORMAL LOW (ref 13.0–17.0)
Platelets: 115 10*3/uL — ABNORMAL LOW (ref 150–400)
RBC: 2.43 MIL/uL — ABNORMAL LOW (ref 4.22–5.81)
WBC: 7.6 10*3/uL (ref 4.0–10.5)

## 2011-05-25 LAB — BASIC METABOLIC PANEL
CO2: 24 mEq/L (ref 19–32)
Calcium: 7.6 mg/dL — ABNORMAL LOW (ref 8.4–10.5)
GFR calc non Af Amer: >90 mL/min (ref >90)
Potassium: 3.3 mEq/L — ABNORMAL LOW (ref 3.5–5.1)
Sodium: 134 mEq/L — ABNORMAL LOW (ref 135–145)

## 2011-05-25 MED ORDER — INSULIN ASPART 100 UNIT/ML ~~LOC~~ SOLN
0.0000 [IU] | SUBCUTANEOUS | Status: DC
  Administered 2011-05-25: 8 [IU] via SUBCUTANEOUS

## 2011-05-25 MED ORDER — INSULIN GLARGINE 100 UNIT/ML ~~LOC~~ SOLN
30.0000 [IU] | Freq: Every day | SUBCUTANEOUS | Status: DC
  Administered 2011-05-25: 30 [IU] via SUBCUTANEOUS

## 2011-05-25 MED ORDER — INSULIN ASPART 100 UNIT/ML ~~LOC~~ SOLN
0.0000 [IU] | Freq: Three times a day (TID) | SUBCUTANEOUS | Status: DC
  Administered 2011-05-25 – 2011-05-26 (×2): 4 [IU] via SUBCUTANEOUS
  Administered 2011-05-26: 2 [IU] via SUBCUTANEOUS
  Administered 2011-05-26 (×2): 4 [IU] via SUBCUTANEOUS
  Administered 2011-05-27 (×2): 2 [IU] via SUBCUTANEOUS
  Administered 2011-05-27: 8 [IU] via SUBCUTANEOUS
  Administered 2011-05-27: 2 [IU] via SUBCUTANEOUS
  Administered 2011-05-28: 3 [IU] via SUBCUTANEOUS
  Administered 2011-05-28: 4 [IU] via SUBCUTANEOUS
  Administered 2011-05-28: 8 [IU] via SUBCUTANEOUS
  Administered 2011-05-29: 2 [IU] via SUBCUTANEOUS
  Administered 2011-05-29: 12 [IU] via SUBCUTANEOUS
  Administered 2011-05-29: 4 [IU] via SUBCUTANEOUS
  Administered 2011-05-30 (×2): 2 [IU] via SUBCUTANEOUS

## 2011-05-25 MED ORDER — POTASSIUM CHLORIDE CRYS ER 20 MEQ PO TBCR
40.0000 meq | EXTENDED_RELEASE_TABLET | Freq: Two times a day (BID) | ORAL | Status: DC
  Administered 2011-05-25 – 2011-05-30 (×11): 40 meq via ORAL
  Filled 2011-05-25 (×11): qty 2

## 2011-05-25 MED ORDER — METOPROLOL TARTRATE 25 MG PO TABS
25.0000 mg | ORAL_TABLET | Freq: Two times a day (BID) | ORAL | Status: DC
  Administered 2011-05-25 (×2): 25 mg via ORAL
  Filled 2011-05-25 (×4): qty 1

## 2011-05-25 MED ORDER — METOPROLOL TARTRATE 25 MG/10 ML ORAL SUSPENSION
12.5000 mg | Freq: Two times a day (BID) | ORAL | Status: DC
  Filled 2011-05-25 (×4): qty 5

## 2011-05-25 MED ORDER — POTASSIUM CHLORIDE CRYS ER 20 MEQ PO TBCR: 20.0000 meq | EXTENDED_RELEASE_TABLET | Freq: Two times a day (BID) | ORAL | Status: DC

## 2011-05-25 MED ORDER — POLYSACCHARIDE IRON COMPLEX 150 MG PO CAPS
150.0000 mg | ORAL_CAPSULE | Freq: Every day | ORAL | Status: DC
  Administered 2011-05-25 – 2011-05-30 (×6): 150 mg via ORAL
  Filled 2011-05-25 (×6): qty 1

## 2011-05-25 MED ORDER — GLUCOSE 40 % PO GEL
ORAL | Status: AC
  Filled 2011-05-25: qty 1

## 2011-05-25 NOTE — Progress Notes (Signed)
Patient did not get dextrose. Accidentally pulled.

## 2011-05-25 NOTE — Progress Notes (Signed)
4 units given at 0420 for CBG of 198.

## 2011-05-25 NOTE — Progress Notes (Signed)
Pt. Not able to do third walk, all portable monitors are in use, power is down, and on generators. Pt. Encouraged to walk in the am. 

## 2011-05-25 NOTE — Progress Notes (Signed)
CARDIAC REHAB PHASE I   PRE:  Rate/Rhythm: 107 ST  BP:  Supine:   Sitting: 136/56  Standing:    SaO2: 94%5L  MODE:  Ambulation: 350 ft   POST:  Rate/Rhythem: 125 ST  BP:  Supine:   Sitting: 128/53  Standing:    SaO2: 93-94% 3L 1005-1030 Pt walked 350 ft on 3L with rolling walker and asst x 1. Tolerated walk well. Sats good on 3L. Left on 3L and notified Rn. To recliner after walk. Discussed CRP 2 and pt gave permission to refer.  Cory Clarke

## 2011-05-25 NOTE — Progress Notes (Addendum)
Gave 4 morphine at 0255 and 5mg  of robtussion at 0301, respectively for pain and coughing. Pain level of 8.

## 2011-05-25 NOTE — Progress Notes (Signed)
Pain 5 out of 10. Gave patient 2 tablets of oxycodone at 0413. CBG= 198

## 2011-05-25 NOTE — Progress Notes (Addendum)
                    [address]            [place] [address]          [phone]     2 Days Post-Op Procedure(s) (LRB): CORONARY ARTERY BYPASS GRAFTING (CABG) (N/A)  Subjective: Had a nonproductive coughing spell overnight, leaving him in a lot of pain.  Feels a little better this am. No more coughing, breathing stable. Eating well, +BM   Objective: Vital signs in last 24 hours: Patient Vitals for the past 24 hrs:  BP Temp Temp src Pulse Resp SpO2 Height Weight  05/25/11 0448 - - - - - 90 % - -  05/25/11 0426 107/56 mmHg 100.4 F (38 C) Oral 105  20  88 % - 80 kg (176 lb 5.9 oz)  05/24/11 2022 103/54 mmHg 98.9 F (37.2 C) Oral 93  19  91 % - -  05/24/11 1828 98/50 mmHg - - 98  20  - - -  05/24/11 1708 109/48 mmHg - - 99  18  - - -  05/24/11 1223 121/49 mmHg 97.8 F (36.6 C) Oral 75  20  95 % 6' (1.829 m) 78.9 kg (173 lb 15.1 oz)  05/24/11 1159 - 98.2 F (36.8 C) Oral - - - - -  05/24/11 1133 111/53 mmHg - - - - - - -  05/24/11 1105 - - - 71  - 93 % - -  05/24/11 1100 98/57 mmHg - - 72  16  94 % - -  05/24/11 1000 - - - 72  11  93 % - -  05/24/11 0900 - - - 73  16  96 % - -   Current Weight  05/25/11 80 kg (176 lb 5.9 oz)  PRE-OPERATIVE WEIGHT: 75kg    Intake/Output from previous day: 03/12 0701 - 03/13 0700 In: 758.1 [P.O.:420; I.V.:138.1; IV Piggyback:200] Out: 640 [Urine:640]  CBGs [id number]  PHYSICAL EXAM:  Heart: tachy, 110s, freq PVCs Lungs: few basilar crackles, diminished BS in bases Wound: stable, small amount of sanguinous oozing from mid-sternum Extremities: mild LE edema, R>L   Lab Results: CBC: Basename 05/25/11 0500 05/24/11 0428  WBC 7.6 9.4  HGB 8.0* 8.3*  HCT 23.3* 24.0*  PLT PENDING 122*   BMET:  Basename 05/25/11 0500 05/24/11 0428  NA 134* 139  K 3.3* 4.0  CL 101 109  CO2 24 23  GLUCOSE 177* 132*  BUN 12 9  CREATININE 0.69 0.65  CALCIUM 7.6* 6.9*    PT/INR:  Basename 05/23/11 1330    LABPROT 16.7*  INR 1.33   CXR: small bilateral effusions/atx, L>R  Assessment/Plan: S/P Procedure(s) (LRB): CORONARY ARTERY BYPASS GRAFTING (CABG) (N/A) CV- sinus tach, BPs borderline, around 100.  Will try to adjust beta blocker dose if BP tolerates. DM-sugars poorly controlled. A1C- 8.0.  Will increase Lantus (was on 25-30 u at home), continue po meds.   Vol overload- diurese. Hypokalemia- replace K+ ABL anemia- monitor. Start Fe. LGF, WBC stable.  Probably atelectasis.  Encouraged increased pulm toilet/IS. CRPI.   LOS: 2 days    COLLINS,GINA H 05/25/2011   Pt seen and examined. Agree with above Encouraged IS, has some platelike atelectasis at bases.

## 2011-05-26 LAB — GLUCOSE, CAPILLARY
Glucose-Capillary: 154 mg/dL — ABNORMAL HIGH (ref 70–99)
Glucose-Capillary: 189 mg/dL — ABNORMAL HIGH (ref 70–99)
Glucose-Capillary: 169 mg/dL — ABNORMAL HIGH (ref 70–99)

## 2011-05-26 LAB — BASIC METABOLIC PANEL
CO2: 26 mEq/L (ref 19–32)
Calcium: 7.9 mg/dL — ABNORMAL LOW (ref 8.4–10.5)
GFR calc non Af Amer: >90 mL/min (ref >90)
Potassium: 4 mEq/L (ref 3.5–5.1)
Sodium: 132 mEq/L — ABNORMAL LOW (ref 135–145)

## 2011-05-26 MED ORDER — LINAGLIPTIN 5 MG PO TABS
10.0000 mg | ORAL_TABLET | Freq: Every day | ORAL | Status: DC
  Administered 2011-05-27 – 2011-05-29 (×3): 10 mg via ORAL
  Filled 2011-05-26 (×3): qty 2

## 2011-05-26 MED ORDER — METOPROLOL TARTRATE 50 MG PO TABS
50.0000 mg | ORAL_TABLET | Freq: Two times a day (BID) | ORAL | Status: DC
  Administered 2011-05-26 (×2): 50 mg via ORAL
  Filled 2011-05-26 (×4): qty 1

## 2011-05-26 MED ORDER — METOPROLOL TARTRATE 25 MG/10 ML ORAL SUSPENSION
12.5000 mg | Freq: Two times a day (BID) | ORAL | Status: DC
  Filled 2011-05-26 (×2): qty 5

## 2011-05-26 MED ORDER — INSULIN GLARGINE 100 UNIT/ML ~~LOC~~ SOLN
35.0000 [IU] | Freq: Every day | SUBCUTANEOUS | Status: DC
  Administered 2011-05-26 – 2011-05-30 (×5): 35 [IU] via SUBCUTANEOUS

## 2011-05-26 MED FILL — Lidocaine HCl IV Inj 20 MG/ML: INTRAVENOUS | Qty: 5 | Status: AC

## 2011-05-26 MED FILL — Mannitol IV Soln 20%: INTRAVENOUS | Qty: 500 | Status: AC

## 2011-05-26 MED FILL — Electrolyte-R (PH 7.4) Solution: INTRAVENOUS | Qty: 6000 | Status: AC

## 2011-05-26 MED FILL — Sodium Chloride IV Soln 0.9%: INTRAVENOUS | Qty: 1000 | Status: AC

## 2011-05-26 MED FILL — Sodium Bicarbonate IV Soln 8.4%: INTRAVENOUS | Qty: 50 | Status: AC

## 2011-05-26 MED FILL — Heparin Sodium (Porcine) Inj 1000 Unit/ML: INTRAMUSCULAR | Qty: 30 | Status: AC

## 2011-05-26 MED FILL — Sodium Chloride Irrigation Soln 0.9%: Qty: 3000 | Status: AC

## 2011-05-26 MED FILL — Heparin Sodium (Porcine) Inj 1000 Unit/ML: INTRAMUSCULAR | Qty: 10 | Status: AC

## 2011-05-26 NOTE — Progress Notes (Addendum)
                    [address]            [place] [address]          [phone]     3 Days Post-Op Procedure(s) (LRB): CORONARY ARTERY BYPASS GRAFTING (CABG) (N/A)  Subjective: Pain much better controlled today. No more coughing spells.  Still tachycardic, +DOE.  Objective: Vital signs in last 24 hours: Patient Vitals for the past 24 hrs:  BP Temp Temp src Pulse Resp SpO2 Weight  05/26/11 0446 - - - - - - 80.3 kg (177 lb 0.5 oz)  05/26/11 0439 132/56 mmHg 98.8 F (37.1 C) Oral 116  16  91 % -  05/25/11 2054 118/60 mmHg 99.3 F (37.4 C) Oral 106  16  91 % -  05/25/11 1353 101/45 mmHg 98.7 F (37.1 C) Oral 104  18  92 % -   Current Weight  05/26/11 80.3 kg (177 lb 0.5 oz)  PRE-OPERATIVE WEIGHT: 75kg    Intake/Output from previous day: 03/13 0701 - 03/14 0700 In: 600 [P.O.:600] Out: 200 [Urine:200]  CBGs 232-192-147-154  PHYSICAL EXAM:  Heart: RRR, tachy 110-120 Lungs: clearer today Wound: clean and dry, no more sternal drainage Extremities: mild RLE edema  Lab Results: CBC: Basename 05/25/11 0500 05/24/11 0428  WBC 7.6 9.4  HGB 8.0* 8.3*  HCT 23.3* 24.0*  PLT 115* 122*   BMET:  Basename 05/26/11 0545 05/25/11 0500  NA 132* 134*  K 4.0 3.3*  CL 101 101  CO2 26 24  GLUCOSE 147* 177*  BUN 8 12  CREATININE 0.63 0.69  CALCIUM 7.9* 7.6*    PT/INR:  Basename 05/23/11 1330  LABPROT 16.7*  INR 1.33     Assessment/Plan: S/P Procedure(s) (LRB): CORONARY ARTERY BYPASS GRAFTING (CABG) (N/A) CV- remains in ST, and HRs increase 120-130s with exertion. BPs generally stable.  Will increase Lopressor dose further and monitor. DM- sugars a little better, but still not well controlled.  Will adjust Lantus again.   Vol overload- continue diuresis. Hypokalemia- resolved. Pulm- continue IS, wean O2 as tolerated. CRPI.   LOS: 3 days    COLLINS,GINA H 05/26/2011   patient seen and examined. Agree with above. Will increase tradjenta for  now, change back to [place] at d/c

## 2011-05-26 NOTE — Progress Notes (Signed)
CARDIAC REHAB PHASE I   PRE:  Rate/Rhythm: 117ST  BP:  Supine:   Sitting: 110/53  Standing:    SaO  92%3L:   MODE:  Ambulation: 350 ft   POST:  Rate/Rhythem: 126-140 ST  BP:  Supine:   Sitting: 117/57  Standing:    SaO2: 94%3L to 2L 92% 0800-0825 Pt's heartrate up today. Walked 350 ft on oxygen and rolling walker and asstx 1. Started on 3L of oxygen but able to decrease to 2L. Pt wanted to increase distance today but HR to 140 and we had to cut walk short. Fatigued. To chair after walk. Left on 2L.  Duanne Limerick

## 2011-05-26 NOTE — Progress Notes (Signed)
Pt ambulated 300 ft on 2L with RW and minimal assist.  HR 120's while ambulating.  No complaints of pain, SOB, dizziness or fatigue.  Helped into bed with call bell in reach.  Will con't plan of care.

## 2011-05-27 LAB — CBC
Hemoglobin: 7.8 g/dL — ABNORMAL LOW (ref 13.0–17.0)
RBC: 2.37 MIL/uL — ABNORMAL LOW (ref 4.22–5.81)

## 2011-05-27 LAB — GLUCOSE, CAPILLARY
Glucose-Capillary: 147 mg/dL — ABNORMAL HIGH (ref 70–99)
Glucose-Capillary: 210 mg/dL — ABNORMAL HIGH (ref 70–99)
Glucose-Capillary: 140 mg/dL — ABNORMAL HIGH (ref 70–99)

## 2011-05-27 MED ORDER — POLYSACCHARIDE IRON COMPLEX 150 MG PO CAPS: 150.0000 mg | ORAL_CAPSULE | Freq: Every day | ORAL | Status: DC

## 2011-05-27 MED ORDER — HYDROCOD POLST-CHLORPHEN POLST 10-8 MG/5ML PO LQCR: 5.0000 mL | Freq: Four times a day (QID) | ORAL | Status: DC | PRN

## 2011-05-27 MED ORDER — ASPIRIN 325 MG PO TBEC: 325.0000 mg | DELAYED_RELEASE_TABLET | Freq: Every day | ORAL | Status: DC

## 2011-05-27 MED ORDER — OXYCODONE HCL 5 MG PO TABS: 5.0000 mg | ORAL_TABLET | ORAL | Status: DC | PRN

## 2011-05-27 MED ORDER — METOPROLOL TARTRATE 25 MG PO TABS: 75.0000 mg | ORAL_TABLET | Freq: Two times a day (BID) | ORAL | Status: DC

## 2011-05-27 MED ORDER — PROPRANOLOL HCL 40 MG PO TABS
40.0000 mg | ORAL_TABLET | Freq: Three times a day (TID) | ORAL | Status: DC
  Administered 2011-05-27 – 2011-05-30 (×8): 40 mg via ORAL
  Filled 2011-05-27 (×10): qty 1

## 2011-05-27 MED ORDER — METOPROLOL TARTRATE 50 MG PO TABS
75.0000 mg | ORAL_TABLET | Freq: Two times a day (BID) | ORAL | Status: DC
  Administered 2011-05-27: 75 mg via ORAL
  Filled 2011-05-27 (×2): qty 1

## 2011-05-27 NOTE — Discharge Instructions (Signed)
Endoscopic Saphenous Vein Harvesting A procedure called saphenous vein harvesting is done as part of some heart surgeries. Blood vessels that carry blood to the heart can become blocked. When that happens, a surgeon can create a detour (bypass) around the blocked area. To do this, the surgeon uses a healthy blood vessel from someplace else in your body. The vein that runs along the inside of your leg (greater saphenous vein) is often used. It goes from your ankle to your groin. Saphenous vein harvesting is the procedure that is done to take part of this vein from your leg. For an endoscopic procedure, only very small cuts (incisions) are needed. The surgeon uses a tiny tool (endoscope) to find and take out part of the vein.  LET YOUR CAREGIVER KNOW ABOUT:  Allergies to food or medicine.   Medicines taken, including vitamins, herbs, eyedrops, over-the-counter medicines, and creams.   Use of steroids (by mouth or creams).   Previous problems with anesthetics or numbing medicines.   History of bleeding problems or blood clots.   Previous surgery.   Problems with your leg veins, such as painful or enlarged (varicose) veins.   Other health problems, including diabetes, history of wound problems, and kidney problems.   Possibility of pregnancy, if this applies.  RISKS AND COMPLICATIONS Saphenous vein harvesting is done as a part of heart surgery.The heart surgery may have risks. However, there are usually few problems with the vein harvesting part of surgery. This is especially true with an endoscopic procedure. However, problems can occur, such as:  Bleeding in the leg.   Bleeding under the skin.   Infection.   Leg pain.   Leg swelling.   Nerve damage.  Some people are more likely than others to have problems with saphenous vein harvesting. They include:  Women.   People with diabetes.   People who are overweight.   People who smoke.   People who have had a blood vessel  disease in their legs.   People who have varicose leg veins.  BEFORE THE PROCEDURE  A medical evaluation will be done. This may include an ultrasound exam to make sure your saphenous vein is healthy.   If you smoke, quit a few weeks before your surgery.   A week before the procedure, stop taking drugs that can cause bleeding during and after your surgery. This includes aspirin, nonsteroidal anti-inflammatory drugs, ibuprofen, and naproxen. Also stop taking vitamin E.   If you take blood thinners, ask your surgeon when you should stop taking them before surgery.   The day before the surgery, eat only a light dinner. Do not eat or drink anything after midnight. Ask your caregiver if it is okay to take any needed medicines with a sip of water.   Arrive at least 1 hour before the surgery, or as directed by your surgeon.  PROCEDURE Saphenous vein harvesting will be done at the very beginning of your heart surgery. It is usually done while your heart surgery is being started.  Small monitors will be put on your body. They are used to check your heart, blood pressure, and oxygen level.   You will be given an intravenous line (IV). A needle will be put in your arm or hand. It is hooked to a plastic tube. Medicine will flow directly into your body through the IV.   You will be given medicine that makes you sleep (general anesthetic).   A tube will be put in your throat to help   you breathe during the surgery. It will also be used to give you anesthetic medicine during the surgery.   A soft tube (catheter) may be put in your bladder to drain urine during and after the surgery.   Your leg will be cleaned with a germ-killing (antiseptic) solution.   When you are asleep, 1 to 3 incisions will be made on the inside of your leg. The endoscope will be put into these incisions. The endoscope is a long, thin tube with a tiny camera on the end. The surgeon will use this tool to find a segment of vein that  will work for the bypass. This segment is cut out.   Clips, ties, or electric current (cautery) may be used to seal off the vein. These methods stop the bleeding. Other veins will take over for the one that is removed to keep your leg healthy.   The surgeon will close the incisions. Clips or small stitches (sutures) may be used.   A drain may be placed under the skin of your leg. It looks like a long, thin tube, and it allows fluids to drain out of your leg after the surgery.   Your leg will probably be wrapped in an elastic bandage or stocking.   The vein that was harvested will be used in your heart surgery.  AFTER THE PROCEDURE  When your heart surgery is over, you will probably be taken directly to the intensive care unit (ICU). You will continue to use a breathing machine (ventilator) and get fluids through the IV for a while.   When no more fluid drains from your leg, the drain in your leg will be taken out.   You will be able to go home once you are recovered from your heart surgery. Most people stay in the hospital for at least 4 days. You may spend 1 to several days in an intensive care area. Then you may spend several days in a regular hospital room.  Document Released: 11/10/2010 Document Revised: 02/17/2011 Document Reviewed: 11/10/2010 [employer] Patient Information 2012 [employer].Coronary Artery Bypass Grafting Care After Refer to this sheet in the next few weeks. These instructions provide you with information on caring for yourself after your procedure. Your caregiver may also give you more specific instructions. Your treatment has been planned according to current medical practices, but problems sometimes occur. Call your caregiver if you have any problems or questions after your procedure.  Recovery from open heart surgery will be different for everyone. Some people feel well after 3 or 4 weeks, while for others it takes longer. After heart surgery, it may be normal  to:  Not have an appetite, feel nauseated by the smell of food, or only want to eat a small amount.   Be constipated because of changes in your diet, activity, and medicines. Eat foods high in fiber. Add fresh fruits and vegetables to your diet. Stool softeners may be helpful.   Feel sad or unhappy. You may be frustrated or cranky. You may have good days and bad days. Do not give up. Talk to your caregiver if you do not feel better.   Feel weakness and fatigue. You many need physical therapy or cardiac rehabilitation to get your strength back.   Develop an irregular heartbeat called atrial fibrillation. Symptoms of atrial fibrillation are a fast, irregular heartbeat or feelings of fluttery heartbeats, shortness of breath, low blood pressure, and dizziness. If these symptoms develop, see your caregiver right away.  MEDICATION    Have a list of all the medicines you will be taking when you leave the hospital. For every medicine, know the following:   Name.   Exact dose.   Time of day to be taken.   How often it should be taken.   Why you are taking it.   Ask which medicines should or should not be taken together. If you take more than one heart medicine, ask if it is okay to take them together. Some heart medicines should not be taken at the same time because they may lower your blood pressure too much.   Narcotic pain medicine can cause constipation. Eat fresh fruits and vegetables. Add fiber to your diet. Stool softener medicine may help relieve constipation.   Keep a copy of your medicines with you at all times.   Do not add or stop taking any medicine until you check with your caregiver.   Medicines can have side effects. Call your caregiver who prescribed the medicine if you:   Start throwing up, have diarrhea, or have stomach pain.   Feel dizzy or lightheaded when you stand up.   Feel your heart is skipping beats or is beating too fast or too slow.   Develop a rash.    Notice unusual bruising or bleeding.  HOME CARE INSTRUCTIONS  After heart surgery, it is important to learn how to take your pulse. Have your caregiver show you how to take your pulse.   Use your incentive spirometer. Ask your caregiver how long after surgery you need to use it.  Care of your chest incision  Tell your caregiver right away if you notice clicking in your chest (sternum).   Support your chest with a pillow or your arms when you take deep breaths and cough.   Follow your caregiver's instructions about when you can bathe or swim.   Protect your incision from sunlight during the first year to keep the scar from getting dark.   Tell your caregiver if you notice:   Increased tenderness of your incision.   Increased redness or swelling around your incision.   Drainage or pus from your incision.  Care of your leg incision(s)  Avoid crossing your legs.   Avoid sitting for long periods of time. Change positions every half hour.   Elevate your leg(s) when you are sitting.   Check your leg(s) daily for swelling. Check the incisions for redness or drainage.   Wear your elastic stockings as told by your caregiver. Take them off at bedtime.  Diet  Diet is very important to heart health.   Eat plenty of fresh fruits and vegetables. Meats should be lean cut. Avoid canned, processed, and fried foods.   Talk to a dietician. They can teach you how to make healthy food and drink choices.  Weight  Weigh yourself every day. This is important because it helps to know if you are retaining fluid that may make your heart and lungs work harder.   Use the same scale each time.   Weigh yourself every morning at the same time. You should do this after you go to the bathroom, but before you eat breakfast.   Your weight will be more accurate if you do not wear any clothes.   Record your weight.   Tell your caregiver if you have gained 2 pounds or more overnight.   Activity Stop any activity at once if you have chest pain, shortness of breath, irregular heartbeats, or dizziness. Get help right   away if you have any of these symptoms.  Bathing.  Avoid soaking in a bath or hot tub until your incisions are healed.   Rest. You need a balance of rest and activity.   Exercise. Exercise per your caregiver's advice. You may need physical therapy or cardiac rehabilitation to help strengthen your muscles and build your endurance.   Climbing stairs. Unless your caregiver tells you not to climb stairs, go up stairs slowly and rest if you tire. Do not pull yourself up by the handrail.   Driving a car. Follow your caregiver's advice on when you may drive. You may ride as a passenger at any time. When traveling for long periods of time in a car, get out of the car and walk around for a few minutes every 2 hours.   Lifting. Avoid lifting, pushing, or pulling anything heavier than 10 pounds for 6 weeks after surgery or as told by your caregiver.   Returning to work. Check with your caregiver. People heal at different rates. Most people will be able to go back to work 6 to 12 weeks after surgery.   Sexual activity. You may resume sexual relations as told by your caregiver.  SEEK MEDICAL CARE IF:  Any of your incisions are red, painful, or have any type of drainage coming from them.   You have an oral temperature above 102 F (38.9 C).   You have ankle or leg swelling.   You have pain in your legs.   You have weight gain of 2 or more pounds a day.   You feel dizzy or lightheaded when you stand up.  SEEK IMMEDIATE MEDICAL CARE IF:  You have angina or chest pain that goes to your jaw or arms. Call your local emergency services right away.   You have shortness of breath at rest or with activity.   You have a fast or irregular heartbeat (arrhythmia).   There is a "clicking" in your sternum when you move.   You have numbness or weakness in your arms or legs.   MAKE SURE YOU:  Understand these instructions.   Will watch your condition.   Will get help right away if you are not doing well or get worse.  Document Released: 09/17/2004 Document Revised: 02/17/2011 Document Reviewed: 05/05/2010 [employer] Patient Information 2012 [employer]. 

## 2011-05-27 NOTE — Progress Notes (Signed)
Cardiac Rehab 1500 Pt observed walking earlier with assistance. Sleeping soundly now. Will followup  Tomorrow. Gabe Glace DunlapRN

## 2011-05-27 NOTE — Progress Notes (Signed)
Monitor tech informs the nurse that the patient had a 27 sec run of SVT with a heart rate of 187. Nurse checked in on patient. Patient was asleep and asymptomatic. Cory Clarke

## 2011-05-27 NOTE — Progress Notes (Addendum)
[address]            [place] [address]          [phone]     4 Days Post-Op  Procedure(s) (LRB): CORONARY ARTERY BYPASS GRAFTING (CABG) (N/A) Subjective: Feels well, + DOE , tachy with ambulation  Objective  Telemetry Sinus tachy, PVC's  Temp:  [98.3 F (36.8 C)-99.6 F (37.6 C)] 98.3 F (36.8 C) (03/15 0422) Pulse Rate:  [105-122] 105  (03/15 0422) Resp:  [18] 18  (03/15 0422) BP: (108-129)/(53-77) 129/68 mmHg (03/15 0422) SpO2:  [91 %-93 %] 92 % (03/15 0422) Weight:  [176 lb 5.9 oz (80 kg)] 176 lb 5.9 oz (80 kg) (03/15 0422)   Intake/Output Summary (Last 24 hours) at 05/27/11 0735 Last data filed at 05/26/11 1700  Gross per 24 hour  Intake   1083 ml  Output    200 ml  Net    883 ml       General appearance: alert, cooperative and no distress Heart: regular rate and rhythm, S1, S2 normal and no rub Lungs: mildly diminished in bases Abdomen: benign exam Extremities: mminor edema Wound: incisions healing well  Lab Results:  Basename 05/26/11 0545 05/25/11 0500  NA 132* 134*  K 4.0 3.3*  CL 101 101  CO2 26 24  GLUCOSE 147* 177*  BUN 8 12  CREATININE 0.63 0.69  CALCIUM 7.9* 7.6*  MG -- --  PHOS -- --   No results found for this basename: AST:2,ALT:2,ALKPHOS:2,BILITOT:2,PROT:2,ALBUMIN:2 in the last 72 hours No results found for this basename: LIPASE:2,AMYLASE:2 in the last 72 hours  Basename 05/27/11 0545 05/25/11 0500  WBC 7.6 7.6  NEUTROABS -- --  HGB 7.8* 8.0*  HCT 22.0* 23.3*  MCV 92.8 95.9  PLT 140* 115*   No results found for this basename: CKTOTAL:4,CKMB:4,TROPONINI:4 in the last 72 hours No components found with this basename: POCBNP:3 No results found for this basename: DDIMER in the last 72 hours No results found for this basename: HGBA1C in the last 72 hours No results found for this basename: CHOL,HDL,LDLCALC,TRIG,CHOLHDL in the last 72 hours No results found for this basename:  TSH,T4TOTAL,FREET3,T3FREE,THYROIDAB in the last 72 hours No results found for this basename: VITAMINB12,FOLATE,FERRITIN,TIBC,IRON,RETICCTPCT in the last 72 hours  Medications: Scheduled    . aspirin EC  325 mg Oral Daily  . atorvastatin  40 mg Oral q1800  . cilostazol  100 mg Oral BID  . enoxaparin  40 mg Subcutaneous QHS  . furosemide  40 mg Oral Daily  . insulin aspart  0-24 Units Subcutaneous TID AC & HS  . insulin aspart  3 Units Subcutaneous TID WC  . insulin glargine  35 Units Subcutaneous Daily  . iron polysaccharides  150 mg Oral Daily  . linagliptin  10 mg Oral Daily  . metoprolol tartrate  50 mg Oral BID  . pantoprazole  40 mg Oral Q1200  . potassium chloride  40 mEq Oral BID  . sodium chloride  3 mL Intravenous Q12H  . DISCONTD: aspirin  324 mg Per Tube Daily  . DISCONTD: insulin glargine  30 Units Subcutaneous Daily  . DISCONTD: linagliptin  5 mg Oral Daily  . DISCONTD: metoprolol tartrate  12.5 mg Per Tube BID  . DISCONTD: metoprolol tartrate  12.5 mg Per Tube BID  . DISCONTD: metoprolol tartrate  25 mg Oral BID  Radiology/Studies:  No results found.  INR: Will add last result for INR, ABG once components are confirmed Will add last 4 CBG results once components are confirmed  Assessment/Plan: S/P Procedure(s) (LRB): CORONARY ARTERY BYPASS GRAFTING (CABG) (N/A)  1. Feels well, does note DOE with activity, cont pulm toilet, breathing tx 2. Increase lopressor to 75 bid,H/H may be a contributing to sx.  3, cont diuresis 4. Cont rehab, poss D/C 1-2 days 5. cbg's pretty well controlled  LOS: 4 days    Cory Clarke,Cory Clarke 3/15/20137:35 AM    1910- Pt walking around room, HR @ 130 but feels well. He was on Inderal 40 mg TID at home, will replace metoprolol with inderal to see if that helps with tachycardia.

## 2011-05-27 NOTE — Progress Notes (Signed)
Pt ambulated in hallway with son approximately 500 feet.  Pt used 2 at 2 liters to keeps sats above 92. Pt sats dropped to mid to lower 80's on RA.  Pt tolerated ambulation well.  Will continue to monitor. Thomas Hoff

## 2011-05-27 NOTE — Progress Notes (Signed)
UR Completed.  Afnan Emberton Jane [phone] 05/27/2011  

## 2011-05-27 NOTE — Discharge Summary (Signed)
[address]            [place] [address]          [phone]      Cory Clarke [birth year] 61 y.o. 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 W/u began when he was being assessed for left rotator cuff repair. Patient is unclear on the exact sequence of events, but he was not cleared from a cardiac perspective. He was found to have severe PAD with ABI of 0.52 on R and 0.47 on L. He had a stress test on 03/18/11 which showed mild global hypokinesis with an EF of 48%. Felt to have balanced ischemia. Cath on 04/12/11 showed total RCA, 90% circumflex. Attempted PTCA of RCA unsuccessful.  Recath with FFR of LAD showed the LAD stenosis to hemodynamically significant. He has not had angina, but have frequent indigestion in the mornings relieved with belching. His exercise is limited by bilateral claudication.   Social History  History   Substance Use Topics   .  Smoking status:  Current Everyday Smoker -- 2.0 packs/day for 40 years     Types:  Cigarettes   .  Smokeless tobacco:  Never Used   .  Alcohol Use:  Yes      3 x/wk.    Current Outpatient Prescriptions   Medication  Sig  Dispense  Refill   .  atorvastatin (LIPITOR) 40 MG tablet  Take 40 mg by mouth daily. PM     .  Cholecalciferol (VITAMIN D-3) 5000 UNITS TABS  Take 1 tablet by mouth daily.     .  cilostazol (PLETAL) 100 MG tablet  Take 100 mg by mouth 2 (two) times daily.     .  insulin glargine (LANTUS) 100 UNIT/ML injection  Inject 25-30 Units into the skin at bedtime.     Marland Kitchen  KRILL OIL PO  Take 2 capsules by mouth daily.     Marland Kitchen  l-methylfolate-B6-B12 (METANX) 3-35-2 MG TABS  Take 1 tablet by mouth 2 (two) times daily.     .  meloxicam (MOBIC) 15 MG tablet  Take 15 mg by mouth daily as needed. For muscle  spasm.     .  propranolol (INDERAL) 40 MG tablet  Take 40 mg by mouth 3 (three) times daily.     .  ramipril (ALTACE) 5 MG capsule  Take 5 mg by mouth daily.     .  sitaGLIPtin (JANUVIA) 100 MG tablet  Take 100 mg by mouth daily.     No Known Allergies  Review of Systems  HENT: Positive for hearing loss.  Respiratory: Positive for wheezing.  Cardiovascular:  Claudication  Gastrointestinal:  Reflux  Genitourinary: Negative.  Musculoskeletal: Positive for myalgias, joint swelling and arthralgias.  Left rotator cuff tear- awaiting surgery  Neurological: Negative for dizziness, facial asymmetry, speech difficulty, weakness, light-headedness, numbness and headaches.  All other systems reviewed and are negative.  BP 117/64  Pulse 80  Resp 20  Ht 5\' 7"  (1.702 m)  Wt 169 lb (76.658 kg)  BMI 26.47 kg/m2  SpO2 94%  Physical Exam  Constitutional: He is oriented to person, place, and time. He appears well-developed and well-nourished. No distress.  HENT:  Head: Normocephalic and atraumatic.  Eyes: EOM are normal. Pupils are equal, round, and reactive to light.  Neck: Neck supple. No JVD present.  +left carotid bruit  Cardiovascular: Normal rate, regular rhythm and normal heart sounds.  Absent DP/PT bilaterally Abnormal Allen's test left arm  Pulmonary/Chest: Breath sounds normal. No respiratory distress. He has no wheezes. He has no rales.  Abdominal: Soft. There is no tenderness.  Musculoskeletal: He exhibits no edema.  Lymphadenopathy:  He has no cervical adenopathy.  Neurological: He is alert and oriented to person, place, and time. No cranial nerve deficit (no focal deficit).  No focal deficit  Skin: Skin is warm and dry.  Psychiatric: He has a normal mood and affect.  Diagnostic Tests:  Stress test/ cardiac catheterizations reviewed.  Impression:  61 yo WM with multiple CRF and a positive stress test during a preoperative evaluation. He has 3 vessel CAD, and probably left main  disease(calcified, small caliber) as well, with FFR evidence of significant impairment to flow in the LAD distribution. CABG is indicated for survival benefit. It is unclear if his indigestion/ belching is related to his heart or is GI in nature.  I had a long discussion with the patient and his wife and reviewed the most recent cath films with them. I discussed the indications, risks, benefits and alternative treatments with them. They understand the reasoning for the recommendation of CABG.  I have discussed with them the general nature of the procedure, need for general anesthesia,and incisions to be used. I discussed the expected hospital stay, overall recovery and short and long term outcomes. They understand the risks include but are not limited to death, stroke, MI, DVT/PE, bleeding, possible need for transfusion, infections, other organ system dysfunction including respiratory, renal, or GI complications. They understand and accept these risks and agree to proceed. They understand he is at increased, but not prohibitive, risk due to DM and PAD.  He has a left carotid bruit which needs to be evaluated. He also is a heavy smoker and needs preoperative PFTs.  He is not a candidate for bilateral IMA due to IDDM and we can't use left radial artery due to his markedly abnormal Allen's test on the left.  Plan:  CABG on Monday 3/11   Hospital Course: Patient was admitted and on 05/23/2011 she was taken the operating room where she underwent the following procedure:  OPERATIVE REPORT  PREOPERATIVE DIAGNOSIS: Left main and three-vessel coronary artery  disease.  POSTOPERATIVE DIAGNOSIS: Left main and three-vessel coronary artery  disease.  PROCEDURE: Median sternotomy, extracorporeal circulation, coronary  artery bypass grafting x5 (left internal mammary artery to left anterior  descending artery, saphenous vein graft to first diagonal, sequential  saphenous vein graft to ramus intermedius and  obtuse marginal 1,  saphenous vein graft to posterior descending), endoscopic vein harvest,  right leg.  SURGEON: Cory Clarke. Dorris Fetch, MD  ASSISTANT: Rowe Clack, PA-C  ANESTHESIA: General.  FINDINGS: Good-quality conduits. OM-1 1 mm fair-quality target.  Remaining targets good quality.    He tolerated the procedure well and was taken to the surgical intensive care unit in stable condition.  Post operative hospital course:  Overall the patient has done well. He was weaned from the ventilator without difficulty. He has maintained stable hemodynamics. All routine lines, monitors, drainage devices have been discontinued in the standard fashion. His blood sugars have been under adequate control using standard protocols with gradual return to home regimen. He has had some sinus  tachycardia but no other significant cardiac dysrhythmias. Beta blocker has been up titrated. He does have an acute blood loss anemia and has been started on iron. There is a possibility he may require transfusion prior to discharge, and we will continue to observe values. He does have a postoperative volume overload but has responded well to gentle diuresis. He is tolerating gradually increasing activities using standard cardiac rehabilitation protocols. Incisions are healing well without evidence of infection. Currently he is felt to be stable for discharge in the next 1-2 days pending ongoing reevaluation.   Basename 05/27/11 0545 05/25/11 0500  WBC 7.6 7.6  HGB 7.8* 8.0*  HCT 22.0* 23.3*  PLT 140* 115*   No results found for this basename: INR:2 in the last 72 hours   Discharge Instructions:  The patient is discharged to home with extensive instructions on wound care and progressive ambulation.  They are instructed not to drive or perform any heavy lifting until returning to see the physician in his office.  Discharge Diagnosis:  CAD Acute blood loss anemia Postoperative volume overload Secondary  Diagnosis: There is no problem list on file for this patient.  Past Medical History  Diagnosis Date  . Hypertension     under control; has been on med. x 1 yr.  . Arthritis     shoulder and knee  . Diabetes mellitus     IDDM  . Hyperlipemia   . Rotator cuff tear, left     AC joint DJD  . Peripheral vascular disease     blockages in legs  . Abnormal cardiovascular stress test   . Sleep apnea 3-4 YRS AGO    no CPAP - did not return for follow-up after sleep study  . Tuberculosis     + SKIN TEST  . GERD (gastroesophageal reflux disease)     ONCE IN AWHILE       Erin, Uecker  Home Medication Instructions JYN:[id number]   Printed on:05/27/11 8657  Medication Information                    atorvastatin (LIPITOR) 40 MG tablet Take 40 mg by mouth at bedtime.            insulin glargine (LANTUS) 100 UNIT/ML injection Inject 25-30 Units into the skin at bedtime. Based on CBG           cilostazol (PLETAL) 100 MG tablet Take 100 mg by mouth 2 (two) times daily.             sitaGLIPtin (JANUVIA) 100 MG tablet Take 100 mg by mouth daily. Takes daily at 1500           vitamin B-12 (CYANOCOBALAMIN) 1000 MCG tablet Take 1,000 mcg by mouth every morning.           Cholecalciferol (VITAMIN D3 PO) Take 1 tablet by mouth every morning. Vitamin D3 6000 units           KRILL OIL 1000 MG CAPS Take 2 capsules by mouth every morning.           aspirin EC 325 MG EC tablet Take 1 tablet (325 mg total) by mouth daily.           chlorpheniramine-HYDROcodone (TUSSIONEX) 10-8 MG/5ML LQCR Take 5 mLs by mouth every 6 (six) hours as needed.           iron polysaccharides (NIFEREX) 150 MG capsule Take 1 capsule (150 mg total) by mouth daily.  metoprolol tartrate (LOPRESSOR) 25 MG tablet Take 3 tablets (75 mg total) by mouth 2 (two) times daily.           oxyCODONE (OXY IR/ROXICODONE) 5 MG immediate release tablet Take 1-2 tablets (5-10 mg total) by mouth every 4 (four) hours  as needed.             Disposition: For discharge home  Patient's condition is Good  Gershon Crane, PA-C 05/27/2011  9:43 AM

## 2011-05-28 ENCOUNTER — Other Ambulatory Visit: Payer: Self-pay

## 2011-05-28 LAB — GLUCOSE, CAPILLARY: Glucose-Capillary: 160 mg/dL — ABNORMAL HIGH (ref 70–99)

## 2011-05-28 MED ORDER — TRAMADOL HCL 50 MG PO TABS: 50.0000 mg | ORAL_TABLET | ORAL | Status: DC | PRN

## 2011-05-28 MED ORDER — LACTULOSE 10 GM/15ML PO SOLN
20.0000 g | Freq: Once | ORAL | Status: AC
  Administered 2011-05-28: 20 g via ORAL
  Filled 2011-05-28: qty 30

## 2011-05-28 MED ORDER — TRAMADOL HCL 50 MG PO TABS
50.0000 mg | ORAL_TABLET | ORAL | Status: DC | PRN
  Administered 2011-05-29: 100 mg via ORAL
  Filled 2011-05-28: qty 2

## 2011-05-28 MED ORDER — ACETAMINOPHEN 325 MG PO TABS
650.0000 mg | ORAL_TABLET | Freq: Four times a day (QID) | ORAL | Status: DC | PRN
  Administered 2011-05-28 – 2011-05-30 (×4): 650 mg via ORAL
  Filled 2011-05-28 (×4): qty 2

## 2011-05-28 MED ORDER — PROPRANOLOL HCL 40 MG PO TABS: 40.0000 mg | ORAL_TABLET | Freq: Three times a day (TID) | ORAL | Status: DC

## 2011-05-28 NOTE — Discharge Summary (Signed)
Addendum Note to D/C KEELAN TRIPODI is a 61 y.o. male who is S/P Procedure(s): CORONARY ARTERY BYPASS GRAFTING (CABG).  He was still requiring 2 liters of O2 via [place] as he desated without it.He was able to be weaned off O2. He then had 2 episodes of increased HR into the 150's 05/28/2011. Likely afib with RVR. He was placed on Amiodarone orally.The patient is stable and ready for discharge.  No changes to History or  Physical Exam.  Changes to medications include: Lopressor has been stopped. Inderal 40 mg po three times daily. Amiodarone 400 bid for one week then 400 mg po daily thereafter.  Patient needs to call Dr. Selena Batten (PCP) for a follow up of [facility]  Ardelle Balls PA-C 05/28/2011 9:56 AM

## 2011-05-28 NOTE — Progress Notes (Signed)
1:47 PM - spoke with Doree Fudge PAC about 5 bt Vtach and SVT bursts today, no new orders received.  Discussed EPW d/c, will wait until tomorrow morning to d/c EPW & CTS.  Ninetta Lights, RN 05/28/2011 1:49 PM

## 2011-05-28 NOTE — Progress Notes (Signed)
4:20 PM - pt ambulated 886ft independently, steady gait, RN at side.  Will continue to monitor.  Dory Horn Leigh,RN 05/28/2011 4:21 PM

## 2011-05-28 NOTE — Progress Notes (Addendum)
5 Days Post-Op Procedure(s) (LRB): CORONARY ARTERY BYPASS GRAFTING (CABG) (N/A)  Subjective: Patient with constipation, feels Inderal is helping better than Lopressor was with his tachycardia.Did not sleep well.  Objective: Vital signs in last 24 hours: Patient Vitals for the past 24 hrs:  BP Temp Temp src Pulse Resp SpO2 Weight  05/28/11 0424 105/60 mmHg 98.6 F (37 C) Oral 109  18  93 % 174 lb 13.2 oz (79.3 kg)  05/27/11 2156 131/70 mmHg - - 116  - - -  05/27/11 2006 108/67 mmHg 98.5 F (36.9 C) Oral 122  18  94 % -  05/27/11 1556 115/66 mmHg 99 F (37.2 C) Oral 109  18  90 % -  05/27/11 1022 122/70 mmHg - - - - - -   Pre op weight  75.3 kg Current Weight  05/28/11 174 lb 13.2 oz (79.3 kg)      Intake/Output from previous day: 03/15 0701 - 03/16 0700 In: 963 [P.O.:960; I.V.:3] Out: -    Physical Exam:  Cardiovascular: RRR, no murmurs, gallops, or rubs. Pulmonary: Slightly diminished at the bases; no rales, wheezes, or rhonchi. Abdomen: Soft, non tender, bowel sounds present. Extremities: Mild bilateral lower extremity edema. Wounds: Clean and dry.  No erythema or signs of infection.  Lab Results: CBC: Basename 05/27/11 0545  WBC 7.6  HGB 7.8*  HCT 22.0*  PLT 140*   BMET:  Basename 05/26/11 0545  NA 132*  K 4.0  CL 101  CO2 26  GLUCOSE 147*  BUN 8  CREATININE 0.63  CALCIUM 7.9*    PT/INR: No results found for this basename: LABPROT,INR in the last 72 hours ABG:  INR: Will add last result for INR, ABG once components are confirmed Will add last 4 CBG results once components are confirmed  Assessment/Plan:  1. CV - SR/ST.On Inderal 40 tid. 2.  Pulmonary - Encourage incentive spirometer.Still on 2L of O2 via [place]. Wean as tolerates. 3. Volume Overload - Continue with diuresis. 4.  Acute blood loss anemia - Last H/H 7.8/22.Continue Nu Iron. 5.DM-CBGs 210/140/176.Pre op HGA1C 8.Continue oral medicine and insulin.Will need follow up as an  outpatient. 6.Thrombocytopenia-Last platelet count 140,000. 7.Remove EPW and CT sutures. 8.LOC constipation. 9.Liekly discharge in am.   ZIMMERMAN,DONIELLE MPA-C 05/28/2011   Patient seen and examined. Agree with above, likely home tomorrow

## 2011-05-28 NOTE — Progress Notes (Addendum)
CARDIAC REHAB PHASE I   PRE:  Rate/Rhythm: 106 sinus tach  BP:  Supine:   Sitting: 90/54  Standing: 112/60   SaO2: 92% RA  MODE:  Ambulation: 550 ft   POST:  Rate/Rhythem: 111 sinus tach  BP:  Supine:   Sitting: 120/50  Standing:   SaO2: 93% RA  immediate up to 95% RA with rest 0905-0945 Pt ambulated in hallway without difficulty.  Denies cp, dyspnea, or dizziness.  Pt returned to chair, call light in reach. Pt education completed. Pt encouraged in IS use, cough and deep breathing exercises.  At pt request referral will be made to [facility].  Pt offered smoking cessation services as an outpatient, phone number given.  Understanding verbalized  Cory Clarke

## 2011-05-29 ENCOUNTER — Other Ambulatory Visit: Payer: Self-pay

## 2011-05-29 LAB — GLUCOSE, CAPILLARY
Glucose-Capillary: 160 mg/dL — ABNORMAL HIGH (ref 70–99)
Glucose-Capillary: 172 mg/dL — ABNORMAL HIGH (ref 70–99)
Glucose-Capillary: 256 mg/dL — ABNORMAL HIGH (ref 70–99)
Glucose-Capillary: 103 mg/dL — ABNORMAL HIGH (ref 70–99)

## 2011-05-29 MED ORDER — AMIODARONE HCL 200 MG PO TABS
400.0000 mg | ORAL_TABLET | Freq: Two times a day (BID) | ORAL | Status: DC
  Administered 2011-05-29 – 2011-05-30 (×3): 400 mg via ORAL
  Filled 2011-05-29 (×4): qty 2

## 2011-05-29 MED ORDER — METOPROLOL TARTRATE 1 MG/ML IV SOLN: 5.0000 mg | Freq: Once | INTRAVENOUS | Status: DC

## 2011-05-29 MED ORDER — METOPROLOL TARTRATE 1 MG/ML IV SOLN
INTRAVENOUS | Status: AC
  Filled 2011-05-29: qty 5

## 2011-05-29 MED ORDER — AMIODARONE HCL 400 MG PO TABS: 400.0000 mg | ORAL_TABLET | Freq: Two times a day (BID) | ORAL | Status: DC

## 2011-05-29 MED ORDER — LINAGLIPTIN 5 MG PO TABS
5.0000 mg | ORAL_TABLET | Freq: Every day | ORAL | Status: DC
  Administered 2011-05-30: 5 mg via ORAL
  Filled 2011-05-29: qty 1

## 2011-05-29 NOTE — Progress Notes (Signed)
1015 - pt ambulated 512ft independently, steady gait.  Will continue to monitor.  Ninetta Lights 05/29/2011 11:23 AM

## 2011-05-29 NOTE — Progress Notes (Signed)
Called by primary RN to see pt for HR 150s ST per EKG. BP 135/83, t 97.9, 92% on 3L.  Encouraged primary RN to call primary MD. Dr. Dorris Fetch consulted and new orders rcvd.  Will cont. To monitor

## 2011-05-29 NOTE — Progress Notes (Signed)
Pt sustained HR 150's-160's greater than 15 mins.  Had some SHOB with this in the beginning.  Was placed on 2L/[place] earlier when he had sustained HR elevation for 7 mins.  Pt was asymtomatic the first time.  Symtomatic this time with also CP as well as the [facility].  BP 130's/80's.    Was given Tramadol 100mg  While paging MD this event.  EKG showed ST during this episode.  Pt dropped back down to 90's as I walked into room to give Lopressor as ordered.  Lopressor held at this time.  Will cont to monitor.   HR sustained 150's -160's for a  total of 24 mins.

## 2011-05-29 NOTE — Progress Notes (Signed)
1545 - Pt ambulated 878ft independently, steady gait.  Will continue to monitor.  Ninetta Lights 05/29/2011

## 2011-05-29 NOTE — Progress Notes (Addendum)
6 Days Post-Op Procedure(s) (LRB): CORONARY ARTERY BYPASS GRAFTING (CABG) (N/A)  Subjective: Patient had a bowel movement yesterday.  Objective: Vital signs in last 24 hours: Patient Vitals for the past 24 hrs:  BP Temp Temp src Pulse Resp SpO2 Weight  05/29/11 0437 135/83 mmHg - Oral 159  - 92 % -  05/29/11 0407 122/71 mmHg 97.9 F (36.6 C) Oral 96  20  94 % 174 lb 6.1 oz (79.1 kg)  05/28/11 2205 121/63 mmHg 100.1 F (37.8 C) Oral 105  19  91 % -  05/28/11 1653 101/61 mmHg - - 110  - - -  05/28/11 1415 114/68 mmHg 98.1 F (36.7 C) Oral 103  20  96 % -   Pre op weight  75.3 kg Current Weight  05/29/11 174 lb 6.1 oz (79.1 kg)      Intake/Output from previous day: 03/16 0701 - 03/17 0700 In: 1203 [P.O.:1200; I.V.:3] Out: 601 [Urine:600; Stool:1]   Physical Exam:  Cardiovascular: RRR, no murmurs, gallops, or rubs. Pulmonary: Slightly diminished at the bases; no rales, wheezes, or rhonchi. Abdomen: Soft, non tender, bowel sounds present. Extremities: Mild bilateral lower extremity edema. Wounds: Clean and dry.  No erythema or signs of infection.  Lab Results: CBC:  Basename 05/27/11 0545  WBC 7.6  HGB 7.8*  HCT 22.0*  PLT 140*   BMET: No results found for this basename: NA:2,K:2,CL:2,CO2:2,GLUCOSE:2,BUN:2,CREATININE:2,CALCIUM:2 in the last 72 hours  PT/INR: No results found for this basename: LABPROT,INR in the last 72 hours ABG:  INR: Will add last result for INR, ABG once components are confirmed Will add last 4 CBG results once components are confirmed  Assessment/Plan:  1. CV - SR/ST.According to the nurse, he had 2 episodes of SVT with HR into 150's. He was symptomatic with the second episode.On Inderal 40 tid.May need to increase or add another medicine. 2.  Pulmonary - Encourage incentive spirometer.Was weaned off of O2 but put back on last evening with above episode. 3. Volume Overload - Continue with diuresis. 4.  Acute blood loss anemia - Last H/H  7.8/22.Continue Nu Iron. 5.DM-CBGs 221/110/160 .Pre op HGA1C 8.Continue oral medicine and insulin.Will need follow up as an outpatient. 6.Thrombocytopenia-Last platelet count 140,000. 7.Fever to 100.1 last evening but is afebrile this am. No signs of wound infection and no GU complaints.Last WBC within normal at 7600. 8.Likely monitor heart rate and rhythm for 24 more hours.   ZIMMERMAN,DONIELLE MPA-C 05/29/2011  Reviewed strips from last night. It looks like it was a fib. Was definitely not sinus tach Will start PO amiodarone. Probably home in AM

## 2011-05-30 LAB — BASIC METABOLIC PANEL
BUN: 14 mg/dL (ref 6–23)
Chloride: 101 mEq/L (ref 96–112)
GFR calc Af Amer: >90 mL/min (ref >90)
GFR calc non Af Amer: >90 mL/min (ref >90)
Potassium: 4.4 mEq/L (ref 3.5–5.1)
Sodium: 137 mEq/L (ref 135–145)

## 2011-05-30 LAB — GLUCOSE, CAPILLARY

## 2011-05-30 MED ORDER — OXYCODONE HCL 5 MG PO TABS: 5.0000 mg | ORAL_TABLET | ORAL | Status: DC | PRN

## 2011-05-30 MED ORDER — SODIUM CHLORIDE 0.9 % IV SOLN
0.7500 g | INTRAVENOUS | Status: DC | PRN
  Administered 2011-05-23: .75 g via INTRAVENOUS

## 2011-05-30 MED ORDER — OXYCODONE HCL 5 MG PO TABS: 5.0000 mg | ORAL_TABLET | ORAL | Status: AC | PRN

## 2011-05-30 MED ORDER — OXYCODONE HCL 5 MG PO TABS
5.0000 mg | ORAL_TABLET | ORAL | Status: DC | PRN
  Administered 2011-05-30 (×2): 10 mg via ORAL
  Filled 2011-05-30 (×2): qty 2

## 2011-05-30 MED ORDER — OXYCODONE HCL 5 MG PO TABS: 10.0000 mg | ORAL_TABLET | ORAL | Status: DC | PRN

## 2011-05-30 NOTE — Progress Notes (Signed)
Previous RN reports that IV infiltrated and removed.  Pt felt he may go home today and did not want to restart IV.  Pt received pain med this am for tenderness at incision site, but did not get full relief.  Only other option was Ultram and IV morphine, which pt did not want.  He requested vicodin.  MD notified and orders given for oxycodone.

## 2011-05-30 NOTE — Progress Notes (Signed)
EPW wires dc per protocol, wires intact, pt tolerated well instructed bedrest for 1 hour, vitals initiated per protocol. CT sutures dc edges intact, benzoin and steristrips applied.

## 2011-05-30 NOTE — Progress Notes (Signed)
Pt walking independently. SaO2 93 RA. Will sign off. Ethelda Chick CES, ACSM

## 2011-05-30 NOTE — Addendum Note (Signed)
Addendum  created 05/30/11 0817 by Adair Laundry, CRNA   Modules edited:Anesthesia Medication Administration

## 2011-05-30 NOTE — Progress Notes (Signed)
                   [address]            [place] [address]          [phone]     7 Days Post-Op  Procedure(s) (LRB): CORONARY ARTERY BYPASS GRAFTING (CABG) (N/A) Subjective: Feels well  Objective  Telemetry paroxysms of SVT till about 17:00 yesterday  Temp:  [97.5 F (36.4 C)-98.3 F (36.8 C)] 97.5 F (36.4 C) (03/18 0700) Pulse Rate:  [90-93] 90  (03/18 0700) Resp:  [18-19] 18  (03/18 0700) BP: (95-122)/(59-70) 122/70 mmHg (03/18 0700) SpO2:  [90 %-93 %] 90 % (03/18 0700) Weight:  [171 lb 4.8 oz (77.7 kg)] 171 lb 4.8 oz (77.7 kg) (03/18 0700)   Intake/Output Summary (Last 24 hours) at 05/30/11 0751 Last data filed at 05/29/11 1855  Gross per 24 hour  Intake    963 ml  Output      1 ml  Net    962 ml       General appearance: alert, cooperative and no distress Heart: regular rate and rhythm and S1, S2 normal Lungs: clear to auscultation bilaterally Abdomen: soft, nontender, non distended Extremities: trace edema Wound: incisions healing well  Lab Results: No results found for this basename: NA:2,K:2,CL:2,CO2:2,GLUCOSE:2,BUN:2,CREATININE:2,CALCIUM:2,MG:2,PHOS:2 in the last 72 hours No results found for this basename: AST:2,ALT:2,ALKPHOS:2,BILITOT:2,PROT:2,ALBUMIN:2 in the last 72 hours No results found for this basename: LIPASE:2,AMYLASE:2 in the last 72 hours No results found for this basename: WBC:2,NEUTROABS:2,HGB:2,HCT:2,MCV:2,PLT:2 in the last 72 hours No results found for this basename: CKTOTAL:4,CKMB:4,TROPONINI:4 in the last 72 hours No components found with this basename: POCBNP:3 No results found for this basename: DDIMER in the last 72 hours No results found for this basename: HGBA1C in the last 72 hours No results found for this basename: CHOL,HDL,LDLCALC,TRIG,CHOLHDL in the last 72 hours No results found for this basename: TSH,T4TOTAL,FREET3,T3FREE,THYROIDAB in the last 72 hours No results found for this basename:  VITAMINB12,FOLATE,FERRITIN,TIBC,IRON,RETICCTPCT in the last 72 hours  Medications: Scheduled    . amiodarone  400 mg Oral BID  . aspirin EC  325 mg Oral Daily  . atorvastatin  40 mg Oral q1800  . cilostazol  100 mg Oral BID  . enoxaparin  40 mg Subcutaneous QHS  . furosemide  40 mg Oral Daily  . insulin aspart  0-24 Units Subcutaneous TID AC & HS  . insulin aspart  3 Units Subcutaneous TID WC  . insulin glargine  35 Units Subcutaneous Daily  . iron polysaccharides  150 mg Oral Daily  . linagliptin  5 mg Oral Daily  . metoprolol      . metoprolol  5 mg Intravenous Once  . pantoprazole  40 mg Oral Q1200  . potassium chloride  40 mEq Oral BID  . propranolol  40 mg Oral TID  . sodium chloride  3 mL Intravenous Q12H  . DISCONTD: linagliptin  10 mg Oral Daily     Radiology/Studies:  No results found.  INR: Will add last result for INR, ABG once components are confirmed Will add last 4 CBG results once components are confirmed  Assessment/Plan: S/P Procedure(s) (LRB): CORONARY ARTERY BYPASS GRAFTING (CABG) (N/A) 1. Cont to monitor rhythm, d/c epw's , poss d/c either later this afternoon vs tomorrow. Cont amiodarone 2. cbg 103-256 range  LOS: 7 days    Cory Clarke E 3/18/20137:51 AM

## 2011-06-06 ENCOUNTER — Ambulatory Visit (INDEPENDENT_AMBULATORY_CARE_PROVIDER_SITE_OTHER): Payer: Self-pay | Admitting: Surgical

## 2011-06-06 ENCOUNTER — Other Ambulatory Visit: Payer: Self-pay | Admitting: Thoracic Surgery (Cardiothoracic Vascular Surgery)

## 2011-06-06 ENCOUNTER — Ambulatory Visit (HOSPITAL_COMMUNITY)
Admission: RE | Admit: 2011-06-06 | Discharge: 2011-06-06 | Disposition: A | Discharge status: Home / Self Care | Payer: 59 | Source: Ambulatory Visit | Attending: Thoracic Surgery (Cardiothoracic Vascular Surgery) | Admitting: Thoracic Surgery (Cardiothoracic Vascular Surgery)

## 2011-06-06 VITALS — BP 100/65 | HR 71 | Temp 97.8°F | Resp 18 | Ht 67.0 in | Wt 172.0 lb

## 2011-06-06 DIAGNOSIS — I2510 Atherosclerotic heart disease of native coronary artery without angina pectoris: Secondary | ICD-10-CM

## 2011-06-06 DIAGNOSIS — M7989 Other specified soft tissue disorders: Secondary | ICD-10-CM | POA: Insufficient documentation

## 2011-06-06 DIAGNOSIS — I739 Peripheral vascular disease, unspecified: Secondary | ICD-10-CM

## 2011-06-06 DIAGNOSIS — Z951 Presence of aortocoronary bypass graft: Secondary | ICD-10-CM

## 2011-06-06 DIAGNOSIS — T8131XA Disruption of external operation (surgical) wound, not elsewhere classified, initial encounter: Secondary | ICD-10-CM

## 2011-06-06 DIAGNOSIS — R609 Edema, unspecified: Secondary | ICD-10-CM

## 2011-06-06 MED ORDER — FUROSEMIDE 40 MG PO TABS: 40.0000 mg | ORAL_TABLET | Freq: Every day | ORAL | Status: DC

## 2011-06-06 MED ORDER — POTASSIUM CHLORIDE ER 10 MEQ PO TBCR: 20.0000 meq | EXTENDED_RELEASE_TABLET | Freq: Two times a day (BID) | ORAL | Status: DC

## 2011-06-06 NOTE — Progress Notes (Signed)
  HPI: Patient seen today with new complaint of right lower extremity swelling. Patient has a history of right superficial femoral artery occlusive disease from previous arteriogram done in February. Patient states the right lower extremity began to have more swelling and some associated pain in the right calf yesterday.  Current Outpatient Prescriptions  Medication Sig Dispense Refill  . amiodarone (PACERONE) 400 MG tablet Take 1 tablet (400 mg total) by mouth 2 (two) times daily. For 7 days; then Amiodarone 400 mg po daily thereafter.  60 tablet  1  . aspirin 325 MG EC tablet Take 1 tablet (325 mg total) by mouth daily.  30 tablet    . atorvastatin (LIPITOR) 40 MG tablet Take 40 mg by mouth at bedtime.       . chlorpheniramine-HYDROcodone (TUSSIONEX) 10-8 MG/5ML LQCR Take 5 mLs by mouth every 6 (six) hours as needed.  140 mL  0  . Cholecalciferol (VITAMIN D3 PO) Take 1 tablet by mouth every morning. Vitamin D3 6000 units      . cilostazol (PLETAL) 100 MG tablet Take 100 mg by mouth 2 (two) times daily.        . insulin glargine (LANTUS) 100 UNIT/ML injection Inject 25-30 Units into the skin at bedtime. Based on CBG      . iron polysaccharides (NIFEREX) 150 MG capsule Take 1 capsule (150 mg total) by mouth daily.  30 capsule  1  . KRILL OIL 1000 MG CAPS Take 2 capsules by mouth every morning.      Marland Kitchen oxyCODONE (OXY IR/ROXICODONE) 5 MG immediate release tablet Take 1-2 tablets (5-10 mg total) by mouth every 4 (four) hours as needed (5mg  for moderate pain, 10 mg for severe pain).  50 tablet  0  . propranolol (INDERAL) 40 MG tablet Take 1 tablet (40 mg total) by mouth 3 (three) times daily.      . sitaGLIPtin (JANUVIA) 100 MG tablet Take 100 mg by mouth daily. Takes daily at 1500      . vitamin B-12 (CYANOCOBALAMIN) 1000 MCG tablet Take 1,000 mcg by mouth every morning.      . furosemide (LASIX) 40 MG tablet Take 1 tablet (40 mg total) by mouth daily.  10 tablet  0  . potassium chloride (K-DUR) 10  MEQ tablet Take 2 tablets (20 mEq total) by mouth 2 (two) times daily.  10 tablet  0    Physical Exam: Gen.-well-developed adult male in no acute distress Pulmonary-clear lung fields throughout Cardiac-regular rate and rhythm, normal S1-S2 Extremities-right lower extremity with 2+ edema, no calf tenderness, distal pulses not palpable. No definite mottling but some bruising into the toes from dependent ecchymosis.  Diagnostic Tests: None  Impression: Some concern of possible acute changes in peripheral arterial disease. Does not appear to be a compartment syndrome clinically.  Plan: We'll send to vascular lab for ankle brachial index and venous duplex. Depending on findings course will be determined.

## 2011-06-06 NOTE — Patient Instructions (Signed)
Obtain ankle-brachial index study at vascular lab as well as a venous duplex today. Take Lasix 40 mg daily and potassium chloride 20 mEq daily for additional 10 days

## 2011-06-06 NOTE — Progress Notes (Signed)
VASCULAR LAB PRELIMINARY  PRELIMINARY  PRELIMINARY  PRELIMINARY  ABI and right LEV have been completed.    Preliminary report: Right lower extremity venous duplex was negative for deep vein thrombosis.  Right ABI was 0.71  Left ABI was 0.65.  These values are improved sine prior exam.  Cory Clarke,   RVT                       06/06/2011, 6:30 PM

## 2011-06-08 ENCOUNTER — Other Ambulatory Visit: Payer: Self-pay | Admitting: Thoracic Surgery (Cardiothoracic Vascular Surgery)

## 2011-06-08 DIAGNOSIS — I2510 Atherosclerotic heart disease of native coronary artery without angina pectoris: Secondary | ICD-10-CM

## 2011-06-16 ENCOUNTER — Ambulatory Visit
Admission: RE | Admit: 2011-06-16 | Discharge: 2011-06-16 | Disposition: A | Discharge status: Home / Self Care | Payer: 59 | Source: Ambulatory Visit | Attending: Thoracic Surgery (Cardiothoracic Vascular Surgery) | Admitting: Thoracic Surgery (Cardiothoracic Vascular Surgery)

## 2011-06-16 ENCOUNTER — Encounter: Payer: Self-pay | Admitting: Thoracic Surgery (Cardiothoracic Vascular Surgery)

## 2011-06-16 ENCOUNTER — Ambulatory Visit (INDEPENDENT_AMBULATORY_CARE_PROVIDER_SITE_OTHER): Payer: 59 | Admitting: Thoracic Surgery (Cardiothoracic Vascular Surgery)

## 2011-06-16 VITALS — BP 118/74 | HR 78 | Temp 98.0°F | Resp 18 | Ht 67.0 in | Wt 161.0 lb

## 2011-06-16 DIAGNOSIS — Z951 Presence of aortocoronary bypass graft: Secondary | ICD-10-CM

## 2011-06-16 DIAGNOSIS — I2510 Atherosclerotic heart disease of native coronary artery without angina pectoris: Secondary | ICD-10-CM

## 2011-06-16 DIAGNOSIS — T8131XA Disruption of external operation (surgical) wound, not elsewhere classified, initial encounter: Secondary | ICD-10-CM

## 2011-06-16 MED ORDER — ZOLPIDEM TARTRATE ER 6.25 MG PO TBCR: 12.5000 mg | EXTENDED_RELEASE_TABLET | Freq: Every evening | ORAL | Status: DC | PRN

## 2011-06-16 NOTE — Progress Notes (Signed)
HPI: Cory Clarke returns 61 today for a scheduled postoperative visit. He coronary bypass grafting x5 on 05/23/2011. His postoperative course postoperative atrial fibrillation, and he was discharged home on amiodarone in addition to the propranolol he was on preoperatively. He states that he's been feeling well. He has not had to take any pain medication the last 4-5 days. He is having difficulty sleeping. His exercise tolerance is good, and he is anxious to get his legs revascularized him to increase his activity.  Current Outpatient Prescriptions  Medication Sig Dispense Refill  . amiodarone (PACERONE) 400 MG tablet Take 1 tablet (400 mg total) by mouth 2 (two) times daily. For 7 days; then Amiodarone 400 mg po daily thereafter.  60 tablet  1  . aspirin 325 MG EC tablet Take 1 tablet (325 mg total) by mouth daily.  30 tablet    . atorvastatin (LIPITOR) 40 MG tablet Take 40 mg by mouth at bedtime.       . chlorpheniramine-HYDROcodone (TUSSIONEX) 10-8 MG/5ML LQCR Take 5 mLs by mouth every 6 (six) hours as needed.  140 mL  0  . Cholecalciferol (VITAMIN D3 PO) Take 1 tablet by mouth every morning. Vitamin D3 6000 units      . cilostazol (PLETAL) 100 MG tablet Take 100 mg by mouth 2 (two) times daily.        . furosemide (LASIX) 40 MG tablet Take 1 tablet (40 mg total) by mouth daily.  10 tablet  0  . insulin glargine (LANTUS) 100 UNIT/ML injection Inject 25-30 Units into the skin at bedtime. Based on CBG      . iron polysaccharides (NIFEREX) 150 MG capsule Take 1 capsule (150 mg total) by mouth daily.  30 capsule  1  . KRILL OIL 1000 MG CAPS Take 2 capsules by mouth every morning.      . potassium chloride (K-DUR) 10 MEQ tablet Take 2 tablets (20 mEq total) by mouth 2 (two) times daily.  10 tablet  0  . propranolol (INDERAL) 40 MG tablet Take 1 tablet (40 mg total) by mouth 3 (three) times daily.      . sitaGLIPtin (JANUVIA) 100 MG tablet Take 100 mg by mouth daily. Takes daily at 1500      . vitamin  B-12 (CYANOCOBALAMIN) 1000 MCG tablet Take 1,000 mcg by mouth every morning.      . zolpidem (AMBIEN CR) 6.25 MG CR tablet Take 2 tablets (12.5 mg total) by mouth at bedtime as needed for sleep.  20 tablet  1  . DISCONTD: ramipril (ALTACE) 5 MG capsule Take 5 mg by mouth every morning.         Physical Exam: BP 118/74  Pulse 78  Temp 98 F (36.7 C)  Resp 18  Ht 5\' 7"  (1.702 m)  Wt 161 lb (73.029 kg)  BMI 25.22 kg/m2  SpO2 98% General well-developed well-nourished no acute distress Lungs slightly diminished left base otherwise clear Cardiac regular rate and rhythm normal S1 and S2 no rubs or murmurs Sternal incision clean dry and intact, sternum stable Leg incisions healing well, no peripheral edema  Diagnostic Tests: Postoperative changes with a very small left pleural effusion  Impression: Cory Clarke is a 61 year old gentleman who had coronary bypass grafting about 3 weeks ago. He is doing very well at this point in time. His exercise tolerance is good and his discomfort is minimal. He needs to have revascularization of his lower extremities, I believe Dr. Consuello Closs is planning to do an angioplasty  for that. If he can be revascularized with angioplasty, he is safe to go ahead with at any time. If he were to need open surgery to revascularize, then I would advise him to wait until his been 6 weeks from his bypass surgery.  He may begin driving appropriate precautions were discussed. He is not to lift any objects that weigh greater than 10 pounds for at least another 3 weeks.  He does complain of difficulty sleeping slightly, prescription for Ambien CR 6.25 mg by mouth each bedtime when necessary, 20 tablets, one refill  Plan: He will continue to be followed by Dr. [name] for his cardiovascular needs.  I will be happy to see him back any time in the future that can be of any further assistance with his care.

## 2011-08-10 ENCOUNTER — Encounter (HOSPITAL_COMMUNITY): Payer: Self-pay | Admitting: Pharmacy Technician

## 2011-08-16 ENCOUNTER — Ambulatory Visit (HOSPITAL_COMMUNITY)
Admission: RE | Admit: 2011-08-16 | Discharge: 2011-08-16 | Disposition: A | Discharge status: Home / Self Care | Payer: 59 | Source: Ambulatory Visit | Attending: Cardiology | Admitting: Cardiology

## 2011-08-16 ENCOUNTER — Encounter (HOSPITAL_COMMUNITY)
Admission: RE | Disposition: A | Discharge status: Home / Self Care | Payer: Self-pay | Source: Ambulatory Visit | Attending: Cardiology

## 2011-08-16 DIAGNOSIS — I10 Essential (primary) hypertension: Secondary | ICD-10-CM | POA: Diagnosis present

## 2011-08-16 DIAGNOSIS — Z951 Presence of aortocoronary bypass graft: Secondary | ICD-10-CM | POA: Insufficient documentation

## 2011-08-16 DIAGNOSIS — I2510 Atherosclerotic heart disease of native coronary artery without angina pectoris: Secondary | ICD-10-CM | POA: Diagnosis present

## 2011-08-16 DIAGNOSIS — I7092 Chronic total occlusion of artery of the extremities: Secondary | ICD-10-CM | POA: Insufficient documentation

## 2011-08-16 DIAGNOSIS — I739 Peripheral vascular disease, unspecified: Secondary | ICD-10-CM

## 2011-08-16 DIAGNOSIS — I70219 Atherosclerosis of native arteries of extremities with intermittent claudication, unspecified extremity: Secondary | ICD-10-CM | POA: Insufficient documentation

## 2011-08-16 DIAGNOSIS — E785 Hyperlipidemia, unspecified: Secondary | ICD-10-CM | POA: Diagnosis present

## 2011-08-16 HISTORY — PX: LOWER EXTREMITY ANGIOGRAM: SHX5508

## 2011-08-16 LAB — POCT ACTIVATED CLOTTING TIME
Activated Clotting Time: 232 seconds
Activated Clotting Time: 193 seconds

## 2011-08-16 LAB — GLUCOSE, CAPILLARY: Glucose-Capillary: 219 mg/dL — ABNORMAL HIGH (ref 70–99)

## 2011-08-16 SURGERY — ANGIOGRAM, LOWER EXTREMITY
Anesthesia: LOCAL

## 2011-08-16 MED ORDER — HEPARIN (PORCINE) IN NACL 2-0.9 UNIT/ML-% IJ SOLN
INTRAMUSCULAR | Status: AC
  Filled 2011-08-16: qty 1000

## 2011-08-16 MED ORDER — HYDROMORPHONE HCL PF 2 MG/ML IJ SOLN
INTRAMUSCULAR | Status: AC
  Filled 2011-08-16: qty 1

## 2011-08-16 MED ORDER — HYDROMORPHONE HCL PF 2 MG/ML IJ SOLN
0.5000 mg | INTRAMUSCULAR | Status: DC | PRN
  Administered 2011-08-16: 0.5 mg via INTRAVENOUS

## 2011-08-16 MED ORDER — ONDANSETRON HCL 4 MG/2ML IJ SOLN: 4.0000 mg | Freq: Four times a day (QID) | INTRAMUSCULAR | Status: DC | PRN

## 2011-08-16 MED ORDER — ACETAMINOPHEN 325 MG PO TABS: 650.0000 mg | ORAL_TABLET | ORAL | Status: DC | PRN

## 2011-08-16 MED ORDER — MIDAZOLAM HCL 2 MG/2ML IJ SOLN
INTRAMUSCULAR | Status: AC
  Filled 2011-08-16: qty 2

## 2011-08-16 MED ORDER — LIDOCAINE-EPINEPHRINE 1 %-1:100000 IJ SOLN
INTRAMUSCULAR | Status: AC
  Filled 2011-08-16: qty 1

## 2011-08-16 MED ORDER — SODIUM CHLORIDE 0.9 % IV SOLN: 1.0000 mL/kg/h | INTRAVENOUS | Status: DC

## 2011-08-16 MED ORDER — CLOPIDOGREL BISULFATE 75 MG PO TABS
600.0000 mg | ORAL_TABLET | Freq: Every day | ORAL | Status: AC
  Administered 2011-08-16: 600 mg via ORAL

## 2011-08-16 MED ORDER — SODIUM CHLORIDE 0.9 % IV SOLN
INTRAVENOUS | Status: DC
  Administered 2011-08-16: 06:00:00 via INTRAVENOUS

## 2011-08-16 MED ORDER — LIDOCAINE-EPINEPHRINE 1 %-1:100000 IJ SOLN: 3.0000 mL | Freq: Once | INTRAMUSCULAR | Status: DC

## 2011-08-16 MED ORDER — HEPARIN SODIUM (PORCINE) 1000 UNIT/ML IJ SOLN
INTRAMUSCULAR | Status: AC
  Filled 2011-08-16: qty 1

## 2011-08-16 MED ORDER — CLOPIDOGREL BISULFATE 300 MG PO TABS
ORAL_TABLET | ORAL | Status: AC
  Filled 2011-08-16: qty 1

## 2011-08-16 MED ORDER — CLOPIDOGREL BISULFATE 75 MG PO TABS: 75.0000 mg | ORAL_TABLET | Freq: Every day | ORAL | Status: DC

## 2011-08-16 MED ORDER — LIDOCAINE HCL (PF) 1 % IJ SOLN
INTRAMUSCULAR | Status: AC
  Filled 2011-08-16: qty 30

## 2011-08-16 NOTE — Discharge Instructions (Signed)
No driving for 48 hours. Bed rest at home today. To call if right groin swelling or pain. Groin Site Care Refer to this sheet in the next few weeks. These instructions provide you with information on caring for yourself after your procedure. Your caregiver may also give you more specific instructions. Your treatment has been planned according to current medical practices, but problems sometimes occur. Call your caregiver if you have any problems or questions after your procedure. HOME CARE INSTRUCTIONS  You may shower 24 hours after the procedure. Remove the bandage (dressing) and gently wash the site with plain soap and water. Gently pat the site dry.   Do not apply powder or lotion to the site.   Do not sit in a bathtub, swimming pool, or whirlpool for 5 to 7 days.   No bending, squatting, or lifting anything over 10 pounds (4.5 kg) as directed by your caregiver.   Inspect the site at least twice daily.   Do not drive home if you are discharged the same day of the procedure. Have someone else drive you.   You may drive 24 hours after the procedure unless otherwise instructed by your caregiver.  What to expect:  Any bruising will usually fade within 1 to 2 weeks.   Blood that collects in the tissue (hematoma) may be painful to the touch. It should usually decrease in size and tenderness within 1 to 2 weeks.  SEEK IMMEDIATE MEDICAL CARE IF:  You have unusual pain at the groin site or down the affected leg.   You have redness, warmth, swelling, or pain at the groin site.   You have drainage (other than a small amount of blood on the dressing).   You have chills.   You have a fever or persistent symptoms for more than 72 hours.   You have a fever and your symptoms suddenly get worse.   Your leg becomes pale, cool, tingly, or numb.   You have heavy bleeding from the site. Hold pressure on the site.  Document Released: 04/02/2010 Document Revised: 02/17/2011 Document Reviewed:  04/02/2010 [facility] Patient Information 2012 [place], [place].

## 2011-08-16 NOTE — Interval H&P Note (Signed)
History and Physical Interval Note:  08/16/2011 7:37 AM  Cory Clarke  has presented today for surgery, with the diagnosis of pvd  The various methods of treatment have been discussed with the patient and family. After consideration of risks, benefits and other options for treatment, the patient has consented to  Procedure(s) (LRB): LOWER EXTREMITY ANGIOGRAM (N/A) as a surgical intervention .  The patients' history has been reviewed, patient examined, no change in status, stable for surgery.  I have reviewed the patients' chart and labs.  Questions were answered to the patient's satisfaction.     [name]

## 2011-08-16 NOTE — H&P (Signed)
  Please see paper chart  

## 2011-08-16 NOTE — CV Procedure (Signed)
Procedures performed: Right Femoral access, cross over from the right femoral artery into the left femoral artery and placement of catheter tip into the left femoral artery. PTA and stenting of chronic total occlusion, left SFA. Stenting performed with the help of 6.0 150 mm covered Viabahn stent.   Indication: Patient is a 61 year old Caucasian male with history of known coronary artery disease status post CABG on 05/23/2011. He is known peripheral arterial disease. Previous peripheral angiography had revealed heavily calcified chronic total occlusion of bilateral SFA. Due to lifestyle limiting claudication he is now brought to the peripheral angiography suite to evaluate his peripheral anatomy and possible angioplasty of the same.   Left femoral arteriogram: There is severe calcification throughout the left SFA. Left SFA is occluded in the mid way and reconstitutes just outside of the Hunter's canal. Three-vessel runoff is noted below the left knee.  Interventional data: Successful PTA and stenting of the left SFA with implantation of a 6.0 150 mmViabahn stent. Stenosis was reduced from 100% to 0% with brisk flow. There is no dissection or thrombus attender the procedure. Patient tolerated the procedure well. There was no immediate competitions.  Recommendation: Patient will need continued aggressive risk modification. Fortunately he is quit smoking. I expect significant improvement in his claudication. He does have residual right SFA occlusion and he'll be brought back for revascularization via left groin access.   TECHNICAL PROCEDURE: Under sterile precautions using a 5-French right femoral access a 5 French pigtail catheter was advanced into the abdominal aorta and with the same catheter I was able to cross over into the left femoral artery and Versacore  wire was utilized and the tip of the wire was carefully patient the midsegment of the left SFA. Angiography was performed. Using intravenous  heparin and maintaining ACT greater than 200 I inserted a angled Ancel 7 French sheath into the left femoral artery. Then I utilized a Glidewire (0.03 fifth of inch) and because of significant amount of difficulty due to heavy calcification and proximal calcified cath I pulled initially utilized 5 [place] end hole catheter out of the body and APV crossing C XI. Cook catheter was utilized and again with significant amount of difficulty and with utilization of a extra-support stiff 0.035 inch Glidewire was utilized and I was able to gain entry into the true lumen of the vessel at the popliteal level. Then I advanced the CXI catheter into the popliteal artery and exchange this to a drainage centimeter Woolley wire. Then I performed a balloon antroplasty with a 4.0 x 2 and 20 mm Powerflex balloon and multiple balloon antiplastic to perform that the atmospheric pressure. Repeat revealed excellent results angiography. Do to distal dissection I utilized a covered stent as dictated above and deployed at under fluoroscopic guidance and then angiography was repeated. Excellent results were evident with very brisk flow. I finished the procedure the post dilatation with a 5 x 100 mm PowerFlex balloon and balloon angioplasty was performed at around 10-12 atmospheric pressure from 30 seconds to 60 seconds x3. Repeat angiography revealed excellent results. The Ancel sheath was then pulled into the right external iliac artery and sutured in place. Patient tolerated the procedure. No immediate complications noted. A total of 190 cc of contrast was utilized for diagnostic and interventional procedure.

## 2011-09-16 ENCOUNTER — Encounter (HOSPITAL_COMMUNITY): Payer: Self-pay | Admitting: Pharmacy Technician

## 2011-10-04 ENCOUNTER — Encounter (HOSPITAL_COMMUNITY): Admission: RE | Payer: Self-pay | Source: Ambulatory Visit

## 2011-10-04 ENCOUNTER — Ambulatory Visit (HOSPITAL_COMMUNITY): Admission: RE | Admit: 2011-10-04 | Payer: 59 | Source: Ambulatory Visit | Admitting: Cardiology

## 2011-10-04 SURGERY — ANGIOGRAM, LOWER EXTREMITY
Anesthesia: LOCAL

## 2011-11-28 ENCOUNTER — Encounter (HOSPITAL_COMMUNITY): Payer: Self-pay

## 2011-12-12 NOTE — H&P (Addendum)
Cory Clarke is an 61 y.o. male.   Chief Complaint: Claudication of both legs. Left leg claudication resolved since angioplasty, now has residual right leg claudication. Know PAD. HPI: Patient is a 61 year old Caucasian male with history of known coronary artery disease status post CABG on 05/23/2011. He is known peripheral arterial disease. Previous peripheral angiography had revealed heavily calcified chronic total occlusion of bilateral SFA. He had undergone Successful PTA and stenting of the left SFA with implantation of a 6.0x150 mm Viabahn stent on 08/16/11. Stenosis was reduced from 100% to 0% with brisk flow. Due to lifestyle limiting claudication of the right calf he is now brought to the peripheral angiography suite to evaluate his peripheral anatomy and possible angioplasty of the same.  No chest pain, dyspnea, PND or orthopnea. No TIA or leg ulceration or rest pain.  Past Medical History  Diagnosis Date  . Hypertension     under control; has been on med. x 1 yr.  . Arthritis     shoulder and knee  . Diabetes mellitus     IDDM  . Hyperlipemia   . Rotator cuff tear, left     AC joint DJD  . Peripheral vascular disease     blockages in legs  . Abnormal cardiovascular stress test   . Sleep apnea 3-4 YRS AGO    no CPAP - did not return for follow-up after sleep study  . Tuberculosis     + SKIN TEST  . GERD (gastroesophageal reflux disease)     ONCE IN AWHILE    Past Surgical History  Procedure Date  . Knee surgery 1980s or 1990s  . Cardiac catheterization     04/11/11+ 04/2011  . Coronary artery bypass graft 05/23/2011    Procedure: CORONARY ARTERY BYPASS GRAFTING (CABG);  Surgeon: Loreli Slot, MD;  Location: [facility] OR;  Service: Open Heart Surgery;  Laterality: N/A;  Times 5. Using endoscopically harvested right  greater saphenous vein and left internal mammary artery.     No family history on file. Social History:  reports that he quit smoking about 6 months ago. His  smoking use included Cigarettes. He has a 80 pack-year smoking history. He has never used smokeless tobacco. He reports that he drinks alcohol. He reports that he does not use illicit drugs.  Allergies:  Allergies  Allergen Reactions  . Tape Other (See Comments)    Redness/irritation of skin   Filed Vitals:   12/13/11 0608  BP: 135/83  Pulse: 77  Temp: 97 F (36.1 C)  TempSrc: Oral  Resp: 18  Height: 5\' 7"  (1.702 m)  Weight: 81.647 kg (180 lb)  SpO2: 97%     GENERAL: Moderately built and normal  body habitus, who is in no acute distress.   Alert and oriented  x 3.   Appears stated age.   There is no cyanosis.     HEENT: normal limits.   PERRLA, No JVD.     CARDIAC EXAM: S1 S2 normal, no gallop or murmur.     CHEST EXAM: No tenderness of chest wall.     Thorocotomy scar has healed well.     LUNGS: Clear to percuss and auscultate.   No rales or ronchi.     ABDOMEN: No hepatosplenomegaly.   BS normal in all 4 quadrants.   Abdomen is non-tender.     NEUROLOGIC EXAM: Grossly intact without any focal deficits.     VASCULAR EXAM:  Carotids Normal.  No skin breakdown. Marland Kitchen  Soft right femoral  bruit present.Femoral pulse normal with soft left worse than the right bruit.   Popliteal pulse not palpable right 2 plus left ; Pedal pulse absent right and 2 plus left.   No prominent pulse felt in the abdomen.   No varicose veins.  Assessment/Plan 1.  PAD/Claudication: Bilateral calf. Now has residual right calf claudication.    PV Cath 08/16/11: PTA/Stent 6x150 mm Viabahn covered stent to Left SFA. 100 to 0%. Has residual right SFA occlusion. patient has a very small hematoma in the right groin and moderate amount of ecchymosis in the right iliac fossa and pubic area.  There is no pulsatile mass.  No clinical suspicion for pseudoaneurysm. LE arterial duplex pre angioplasty on 01/17/11; RABI 0.53, LABI 0.47  suggesting severe PAD.    2. CAD/ASHD: CAD/ASHD  H/O CABG 05/23/11 by Dr. Dorris Fetch: LIMA  to LAD, SVG to D1, SVG to RI/OM1, SVG to PDA.: Heart cath 05/10/11 : Occluded RCA, Mid LAD 80%, D1 80%, Cx 70% mid. Doing well and on appropriate medical therapy.  No angina, dyspnea or CHF on exam.   Continue present medical therapy.    ECG 07/25/11: NSR @ 72/min. Normal intervals. No ischemia. Normal EKG. 3. Post operative A. Fibrillation resolved.  No recurrence.  4. Hyperlipidemia.    5. Tobacco use disorder quit since CABG.  6. Diabetes Mellitus II controlled.  [name], MD 12/12/2011, 10:32 PM

## 2011-12-13 ENCOUNTER — Ambulatory Visit (HOSPITAL_COMMUNITY)
Admission: RE | Admit: 2011-12-13 | Discharge: 2011-12-13 | Disposition: A | Discharge status: Home / Self Care | Payer: 59 | Source: Ambulatory Visit | Attending: Cardiology | Admitting: Cardiology

## 2011-12-13 ENCOUNTER — Encounter (HOSPITAL_COMMUNITY)
Admission: RE | Disposition: A | Discharge status: Home / Self Care | Payer: Self-pay | Source: Ambulatory Visit | Attending: Cardiology

## 2011-12-13 DIAGNOSIS — Z794 Long term (current) use of insulin: Secondary | ICD-10-CM | POA: Insufficient documentation

## 2011-12-13 DIAGNOSIS — E785 Hyperlipidemia, unspecified: Secondary | ICD-10-CM | POA: Insufficient documentation

## 2011-12-13 DIAGNOSIS — I70219 Atherosclerosis of native arteries of extremities with intermittent claudication, unspecified extremity: Secondary | ICD-10-CM | POA: Insufficient documentation

## 2011-12-13 DIAGNOSIS — G4730 Sleep apnea, unspecified: Secondary | ICD-10-CM | POA: Insufficient documentation

## 2011-12-13 DIAGNOSIS — K219 Gastro-esophageal reflux disease without esophagitis: Secondary | ICD-10-CM | POA: Insufficient documentation

## 2011-12-13 DIAGNOSIS — I10 Essential (primary) hypertension: Secondary | ICD-10-CM | POA: Insufficient documentation

## 2011-12-13 DIAGNOSIS — E119 Type 2 diabetes mellitus without complications: Secondary | ICD-10-CM | POA: Insufficient documentation

## 2011-12-13 HISTORY — PX: LOWER EXTREMITY ANGIOGRAM: SHX5508

## 2011-12-13 LAB — POCT I-STAT, CHEM 8
BUN: 17 mg/dL (ref 6–23)
Calcium, Ion: 1.15 mmol/L (ref 1.13–1.30)
Chloride: 107 mEq/L (ref 96–112)
Glucose, Bld: 163 mg/dL — ABNORMAL HIGH (ref 70–99)
HCT: 43 % (ref 39.0–52.0)
Potassium: 3.8 mEq/L (ref 3.5–5.1)

## 2011-12-13 LAB — POCT ACTIVATED CLOTTING TIME
Activated Clotting Time: 244 seconds
Activated Clotting Time: 229 seconds

## 2011-12-13 LAB — GLUCOSE, CAPILLARY: Glucose-Capillary: 163 mg/dL — ABNORMAL HIGH (ref 70–99)

## 2011-12-13 SURGERY — ANGIOGRAM, LOWER EXTREMITY
Anesthesia: LOCAL

## 2011-12-13 MED ORDER — HYDROMORPHONE HCL PF 2 MG/ML IJ SOLN
INTRAMUSCULAR | Status: AC
  Filled 2011-12-13: qty 1

## 2011-12-13 MED ORDER — ONDANSETRON HCL 4 MG/2ML IJ SOLN: 4.0000 mg | Freq: Four times a day (QID) | INTRAMUSCULAR | Status: DC | PRN

## 2011-12-13 MED ORDER — MIDAZOLAM HCL 2 MG/2ML IJ SOLN
INTRAMUSCULAR | Status: AC
  Filled 2011-12-13: qty 2

## 2011-12-13 MED ORDER — ACETAMINOPHEN 325 MG PO TABS: 650.0000 mg | ORAL_TABLET | ORAL | Status: DC | PRN

## 2011-12-13 MED ORDER — LIDOCAINE HCL (PF) 1 % IJ SOLN
INTRAMUSCULAR | Status: AC
  Filled 2011-12-13: qty 30

## 2011-12-13 MED ORDER — ONDANSETRON HCL 4 MG/2ML IJ SOLN
INTRAMUSCULAR | Status: AC
  Administered 2011-12-13: 4 mg
  Filled 2011-12-13: qty 2

## 2011-12-13 MED ORDER — HEPARIN SODIUM (PORCINE) 1000 UNIT/ML IJ SOLN
INTRAMUSCULAR | Status: AC
  Filled 2011-12-13: qty 1

## 2011-12-13 MED ORDER — HEPARIN (PORCINE) IN NACL 2-0.9 UNIT/ML-% IJ SOLN
INTRAMUSCULAR | Status: AC
  Filled 2011-12-13: qty 500

## 2011-12-13 MED ORDER — SODIUM CHLORIDE 0.9 % IV SOLN: INTRAVENOUS | Status: DC

## 2011-12-13 MED ORDER — SODIUM CHLORIDE 0.9 % IV SOLN
INTRAVENOUS | Status: DC
  Administered 2011-12-13: 1000 mL via INTRAVENOUS

## 2011-12-13 MED ORDER — SODIUM CHLORIDE 0.9 % IV SOLN: 1.0000 mL/kg/h | INTRAVENOUS | Status: DC

## 2011-12-13 NOTE — Interval H&P Note (Signed)
History and Physical Interval Note:  12/13/2011 7:34 AM  Cory Clarke  has presented today for surgery, with the diagnosis of claudication  The various methods of treatment have been discussed with the patient and family. After consideration of risks, benefits and other options for treatment, the patient has consented to  Procedure(s) (LRB) with comments: LOWER EXTREMITY ANGIOGRAM (N/A) and possible angioplasty as a surgical intervention .  The patient's history has been reviewed, patient examined, no change in status, stable for surgery.  I have reviewed the patient's chart and labs.  Questions were answered to the patient's satisfaction.     [name]

## 2011-12-13 NOTE — CV Procedure (Signed)
Procedure performed: Left femoral arterial access, cross over from the left femoral artery to right femoral artery, placement of catheter tip into right external iliac artery in right femoral arteriogram with distal runoff, placement of catheter tip into the distal SFA. Attempted PTA of the chronically totally occluded right SFA. Indication sutures of breath or is a 61 year old Caucasian male with history of known peripheral artery coronary artery disease. He had undergone usual angioplasty to the left suppression for artery which is chronically totally occluded. He has known right SFA occlusion. Do to lifestyle limiting claudication he is brought to the peripheral angiography suite for evaluation of his right lower extremity and right femoral arteriogram and possible attempt at PTA.   Angiographic data: Right  Common femoral artery is widely patent. The right superficial femoral artery and the proximal segment has a 70% stenosis to 80% stenosis and has soft plaque. The right SFA in the midsegment and this cannot, has heavily calcified chronic total occlusion. Distal SFA has mild calcific disease, the popliteal artery is widely patent.  Below the right knee, the peroneal artery is diffusely diseased distally. The posterior tibial artery is widely patent and normal. The anterior tibial artery has mild luminal irregularity and the dorsalis pedis has a high-grade 80-90% stenosis.  Interventional data: Unsuccessful attempt at PTA and angioplasty of the right superficial femoral artery which is chronically totally occluded. In spite of utilization of a 0.035th of inch Glidewire and also utilization of a 5 [place] end no glide cath catheter and utilization of CXR angled support catheter and utilization of a approach CTO 25 g microwire, I was then able to cross the CTO. Is able to advanced the guidewire roof also lumen however I could not track the CSI device due to heavy calcification.  Recommendation due to heavy  calcification and CTO, I will see if I can use True Path catheter to cross the CTO. Otherwise use of a OutBack can be reattempted. The arterial access was closed with Mynx with excellent hemostasis. Patient of procedure. There was no immediate competitions.  Technical procedure: Using a 5 French left femoral arterial access, I utilized a pigtail catheter to cross over from the left femoral artery the right common femoral artery. I placed a Versacore  wire into the right profunda femoral artery and then I utilized the same catheter to perform right femoral arteriogram with distal runoff.  Technique of intervention: Using confined to correlation maintaining ACT greater than 2/102, I advanced a 0.035 inch Glidewire with the help of a 5 French angled catheter which was like to and try to cross the CTO. Because of elevated I utilized the above said CXR catheter and CTO microwire, 25 g and reattempted crossing the CTO. After multiple attempts the procedure was abandoned. Left femoral arteriogram was performed through the arterial access sheath and access was closed with 6/7 French Mynx device with excellent hemostasis. Patient will be discharged home today with outpatient followup.

## 2012-02-02 ENCOUNTER — Encounter (HOSPITAL_COMMUNITY): Payer: Self-pay | Admitting: Respiratory Therapy

## 2012-02-14 ENCOUNTER — Ambulatory Visit (HOSPITAL_COMMUNITY)
Admission: RE | Admit: 2012-02-14 | Discharge: 2012-02-14 | Disposition: A | Discharge status: Home / Self Care | Payer: 59 | Source: Ambulatory Visit | Attending: Cardiology | Admitting: Cardiology

## 2012-02-14 ENCOUNTER — Encounter (HOSPITAL_COMMUNITY)
Admission: RE | Disposition: A | Discharge status: Home / Self Care | Payer: Self-pay | Source: Ambulatory Visit | Attending: Cardiology

## 2012-02-14 DIAGNOSIS — Z794 Long term (current) use of insulin: Secondary | ICD-10-CM | POA: Insufficient documentation

## 2012-02-14 DIAGNOSIS — I7092 Chronic total occlusion of artery of the extremities: Secondary | ICD-10-CM | POA: Insufficient documentation

## 2012-02-14 DIAGNOSIS — I70219 Atherosclerosis of native arteries of extremities with intermittent claudication, unspecified extremity: Secondary | ICD-10-CM | POA: Insufficient documentation

## 2012-02-14 DIAGNOSIS — I10 Essential (primary) hypertension: Secondary | ICD-10-CM | POA: Insufficient documentation

## 2012-02-14 DIAGNOSIS — E119 Type 2 diabetes mellitus without complications: Secondary | ICD-10-CM | POA: Insufficient documentation

## 2012-02-14 HISTORY — PX: LOWER EXTREMITY ANGIOGRAM: SHX5508

## 2012-02-14 LAB — POCT I-STAT, CHEM 8
Calcium, Ion: 1.17 mmol/L (ref 1.13–1.30)
Chloride: 106 mEq/L (ref 96–112)
HCT: 43 % (ref 39.0–52.0)
Hemoglobin: 14.6 g/dL (ref 13.0–17.0)
Potassium: 4 mEq/L (ref 3.5–5.1)

## 2012-02-14 LAB — POCT ACTIVATED CLOTTING TIME
Activated Clotting Time: 244 seconds
Activated Clotting Time: 234 seconds

## 2012-02-14 LAB — GLUCOSE, CAPILLARY: Glucose-Capillary: 110 mg/dL — ABNORMAL HIGH (ref 70–99)

## 2012-02-14 SURGERY — ANGIOGRAM, LOWER EXTREMITY
Anesthesia: LOCAL

## 2012-02-14 MED ORDER — LIDOCAINE HCL (PF) 1 % IJ SOLN
INTRAMUSCULAR | Status: AC
  Filled 2012-02-14: qty 30

## 2012-02-14 MED ORDER — ONDANSETRON HCL 4 MG/2ML IJ SOLN
INTRAMUSCULAR | Status: AC
  Filled 2012-02-14: qty 2

## 2012-02-14 MED ORDER — MIDAZOLAM HCL 2 MG/2ML IJ SOLN
INTRAMUSCULAR | Status: AC
  Filled 2012-02-14: qty 2

## 2012-02-14 MED ORDER — HEPARIN SODIUM (PORCINE) 1000 UNIT/ML IJ SOLN
INTRAMUSCULAR | Status: AC
  Filled 2012-02-14: qty 1

## 2012-02-14 MED ORDER — VERAPAMIL HCL 2.5 MG/ML IV SOLN
INTRAVENOUS | Status: AC
  Filled 2012-02-14: qty 2

## 2012-02-14 MED ORDER — NITROGLYCERIN IN D5W 200-5 MCG/ML-% IV SOLN
INTRAVENOUS | Status: AC
  Filled 2012-02-14: qty 250

## 2012-02-14 MED ORDER — ACETAMINOPHEN 325 MG PO TABS: 650.0000 mg | ORAL_TABLET | ORAL | Status: DC | PRN

## 2012-02-14 MED ORDER — SODIUM CHLORIDE 0.9 % IV SOLN: 1.0000 mL/kg/h | INTRAVENOUS | Status: DC

## 2012-02-14 MED ORDER — HYDROMORPHONE HCL PF 2 MG/ML IJ SOLN
INTRAMUSCULAR | Status: AC
  Filled 2012-02-14: qty 1

## 2012-02-14 MED ORDER — LIDOCAINE-EPINEPHRINE 1 %-1:100000 IJ SOLN
INTRAMUSCULAR | Status: AC
  Filled 2012-02-14: qty 1

## 2012-02-14 MED ORDER — ONDANSETRON HCL 4 MG/2ML IJ SOLN: 4.0000 mg | Freq: Four times a day (QID) | INTRAMUSCULAR | Status: DC | PRN

## 2012-02-14 MED ORDER — SODIUM CHLORIDE 0.9 % IV SOLN: INTRAVENOUS | Status: DC

## 2012-02-14 NOTE — H&P (Signed)
Cory Clarke is an 61 y.o. male.   Chief Complaint: Claudication HPI: Patient is a 61 year old Caucasian male of known coronary artery disease and known peripheral artery disease. He recently underwent peripheral angiography on 12/13/2011 and was found to have heavily calcified right SFA occlusion and is unable to revascularize this on a routine basis, I have scheduled him for elective atherectomy/revascularization using a new True Path CTO device. He now presents her for elective peripheral hospitalized [place]. He is severe lifestyle limiting claudication right lower D. He has undergone previous angioplasty to occluded left SFA previously and is then relieved of December. He does not have any rest pain or any limb threatening ischemia or fluid discoloration of his toes or ulceration of his toes. He also has history of known coronary artery disease and is status post CABG. Presently without any evidence of angina pectoris clinically or heart failure. He used to be a heavy smoker, which was now.  Past Medical History  Diagnosis Date  . Hypertension     under control; has been on med. x 1 yr.  . Arthritis     shoulder and knee  . Diabetes mellitus     IDDM  . Hyperlipemia   . Rotator cuff tear, left     AC joint DJD  . Peripheral vascular disease     blockages in legs  . Abnormal cardiovascular stress test   . Sleep apnea 3-4 YRS AGO    no CPAP - did not return for follow-up after sleep study  . Tuberculosis     + SKIN TEST  . GERD (gastroesophageal reflux disease)     ONCE IN AWHILE    Past Surgical History  Procedure Date  . Knee surgery 1980s or 1990s  . Cardiac catheterization     04/11/11+ 04/2011  . Coronary artery bypass graft 05/23/2011    Procedure: CORONARY ARTERY BYPASS GRAFTING (CABG);  Surgeon: Loreli Slot, MD;  Location: [facility] OR;  Service: Open Heart Surgery;  Laterality: N/A;  Times 5. Using endoscopically harvested right  greater saphenous vein and left internal  mammary artery.     No family history on file. Social History:  reports that he quit smoking about 8 months ago. His smoking use included Cigarettes. He has a 80 pack-year smoking history. He has never used smokeless tobacco. He reports that he drinks alcohol. He reports that he does not use illicit drugs.  Allergies:  Allergies  Allergen Reactions  . Tape Other (See Comments)    Redness/irritation of skin    Medications Prior to Admission  Medication Sig Dispense Refill  . aspirin EC 81 MG tablet Take 81 mg by mouth daily.      Marland Kitchen atorvastatin (LIPITOR) 40 MG tablet Take 40 mg by mouth at bedtime.       . Cholecalciferol (VITAMIN D3 PO) Take 1 tablet by mouth every morning. Vitamin D3 6000 units      . clopidogrel (PLAVIX) 75 MG tablet Take 1 tablet (75 mg total) by mouth daily.  30 tablet  6  . insulin aspart (NOVOLOG) 100 UNIT/ML injection Inject 3 Units into the skin 3 (three) times daily before meals.      . insulin glargine (LANTUS) 100 UNIT/ML injection Inject 25-30 Units into the skin at bedtime. Based on CBG      . KRILL OIL 1000 MG CAPS Take 1 capsule by mouth every morning.       . Naproxen Sodium (ALEVE) 220 MG CAPS Take  2 capsules by mouth daily as needed. For pain      . propranolol (INDERAL) 40 MG tablet Take 40 mg by mouth 3 (three) times daily.      . ramipril (ALTACE) 5 MG capsule Take 5 mg by mouth daily.      . vitamin B-12 (CYANOCOBALAMIN) 1000 MCG tablet Take 1,000 mcg by mouth daily.          Review of Systems - No bowel or bladder disturbances, no dark stool. No history to suggest TIA or stroke. Patient is diabetic and is well controlled. No shortness breath PND or orthopnea. No leg edema. Systems are negative.  Blood pressure 159/85, pulse 72, temperature 98 F (36.7 C), temperature source Oral, resp. rate 18, height 5\' 7"  (1.702 m), weight 83.915 kg (185 lb), SpO2 97.00%. General appearance: alert, cooperative, appears stated age and no distress Eyes:  conjunctivae/corneas clear. PERRL, EOM's intact. Fundi benign. Neck: no adenopathy, no carotid bruit, no JVD, supple, symmetrical, trachea midline and thyroid not enlarged, symmetric, no tenderness/mass/nodules Neck: JVP - normal, carotids 2+= without bruits Resp: clear to auscultation bilaterally Chest wall: no tenderness Cardio: regular rate and rhythm, S1, S2 normal, no murmur, click, rub or gallop GI: soft, non-tender; bowel sounds normal; no masses,  no organomegaly Extremities: extremities normal, atraumatic, no cyanosis or edema and Bilateral femoral arterial bruit heard, left popliteal pulse is 2+, left PT 2+. Right femoral soft bruit and absent right popliteal and faint pedal pulses. No ulceration. Pulses: 2+ and symmetric See above Skin: Skin color, texture, turgor normal. No rashes or lesions Neurologic: Grossly normal  Results for orders placed during the hospital encounter of 02/14/12 (from the past 48 hour(s))  POCT I-STAT, CHEM 8     Status: Abnormal   Collection Time   02/14/12  6:11 AM      Component Value Range Comment   Sodium 141  135 - 145 mEq/L    Potassium 4.0  3.5 - 5.1 mEq/L    Chloride 106  96 - 112 mEq/L    BUN 11  6 - 23 mg/dL    Creatinine, Ser 7.82  0.50 - 1.35 mg/dL    Glucose, Bld 956 (*) 70 - 99 mg/dL    Calcium, Ion 2.13  0.86 - 1.30 mmol/L    TCO2 22  0 - 100 mmol/L    Hemoglobin 14.6  13.0 - 17.0 g/dL    HCT 57.8  46.9 - 62.9 %    No results found.  Labs:   Lab Results  Component Value Date   WBC 7.6 05/27/2011   HGB 14.6 02/14/2012   HCT 43.0 02/14/2012   MCV 92.8 05/27/2011   PLT 140* 05/27/2011    Lab 02/14/12 0611  NA 141  K 4.0  CL 106  CO2 --  BUN 11  CREATININE 0.80  CALCIUM --  PROT --  BILITOT --  ALKPHOS --  ALT --  AST --  GLUCOSE 157*   No results found for this basename: CKTOTAL, CKMB, CKMBINDEX, TROPONINI    Lipid Panel  No results found for this basename: chol, trig, hdl, cholhdl, vldl, ldlcalc     Assessment/Plan 1. PAD with lifestyle limiting claudication scheduled for lower arteriogram and possible angioplasty.  [name], MD 02/14/2012, 7:45 AM [facility] Pager: [phone] Office: [phone] If no answer: Cell:  [phone]

## 2012-02-14 NOTE — Op Note (Addendum)
Procedure performed: Left femoral arterial access, cross over into the right femoral artery with the help of a Omni Flush 5 French catheter and placement of catheter tip into the right superficial femoral artery, PTA,  with utilization of CTO True Path device over a angled CXI catheter, atherectomy with a CSI Stelth Heavy 2.0 Crown, balloon angioplasty following atherectomy with utilization of a 5.0 x 100 mm Mustang balloon, PTA and stenting of the mid to distal right SFA with 2 overlapping 6.0 by120 mm and a 6.0 x 80 mm [employer] Epic self-expanding stent followed by post dilatation with the 5.0 x 100 mm Mustang balloon. Stand-alone proximal and ostial right SFA atherectomy with a CSI 2.0 Crown at a peak of 9000 rpm per minute. Intra-arterial nitroglycerin administration.  Indication patient is a 61 year old gentleman with history of known peripheral arterial disease who was scheduled for a elective peripheral angiography and angioplasty for a known right SFA occlusion with symptomatic, lifestyle limiting claudication. About 6 weeks ago angiography had revealed heavy calcification and he was felt to be a good candidate for accessing the true lumen with the help of a CTO True Path device, hence is brought to the peripheral angiography Suite 5 dilation of the same.  Interventional data: Successful PTA, atherectomy and stenting of the chronic total occlusion of the right superficial femoral artery following atherectomy of the ostial SFA with a CSI 2.0 Crown at 9000 rpm per second stand-alone.  Chronic total occlusion reduced from 100% to 0% and proximal SFA stenosis reduced from 80% to less than 20-30% with brisk flow.  Technique of the  procedure: Under sterile precautions using a 5 French left femoral arterial access, I utilized a 5 French Omni Flush catheter to cross over from the left femoral artery into the right femoral artery. A Versacore 0.035 inch wire was utilized. This was followed by  crossover catheter with placement of Ancel 6.0 angled sheath into the right superficial femoral artery. This was followed by advancing a CXI angled Cook support 2.6 French catheter at the site of stenosis. This was also utilized to perform initial CTO cross with the help of a [employer] true path catheter followed by utilization of a Cook approach 25 g wire for crossing midsegment of the CTO. This was followed by atherectomy using CSI catheter as dictated above. Multiple runs were made very cautiously after confirming that the heavy wire was in the true lumen and not in the dissection plane by performing cineangiograms in multiple angled views prior to introducing atherectomy catheter. The atherectomy catheter was utilized at 60, 90 and also 120 relolutions per minute. Multiple passes were done at the site of CTO. This was followed by atherectomy in the proximal segment of the right SFA and the ostial SFA. Then I utilized the [employer] 5 mm balloon to perform balloon angioplasty only at the CTO site followed by implantation of 180 mm self-expanding stent as dictated above with overlapping with a 80 mm self-expanding stent as dictated above into the distal and  midsegment of the right SFA. This was followed by repeat balloon antroplasty with a 5.0 x 16 mm balloon at peak of 12 in the proximal segment and 16 in the distal segment atmospheric pressure. Angiography was performed following angioplasty which revealed excellent brisk flow with maintenance of three-vessel runoff below the right knee.  The Ancel sheath was then withdrawn into the left femoral artery and sutured in place after exchanging to a short 6 [place] sheath. Patient tolerated  procedure well. There was no complication. Patient will be discharged home with outpatient followup. Patient received intravenous heparin throughout the procedure and ACT was maintained at close to 250 seconds. A total of her in 160 cc of contrast was utilized for  diagnostic and interventional procedure.

## 2012-02-14 NOTE — Interval H&P Note (Signed)
History and Physical Interval Note:  02/14/2012 7:50 AM  Cory Clarke  has presented today for surgery, with the diagnosis of claudication  The various methods of treatment have been discussed with the patient and family. After consideration of risks, benefits and other options for treatment, the patient has consented to  Procedure(s) (LRB) with comments: LOWER EXTREMITY ANGIOGRAM (N/A) and possible angioplasty as a surgical intervention .  The patient's history has been reviewed, patient examined, no change in status, stable for surgery.  I have reviewed the patient's chart and labs.  Questions were answered to the patient's satisfaction.     [name]

## 2012-04-27 ENCOUNTER — Encounter (HOSPITAL_COMMUNITY): Payer: Self-pay | Admitting: Pharmacy Technician

## 2012-04-30 MED ORDER — SODIUM CHLORIDE 0.9 % IV SOLN
INTRAVENOUS | Status: DC
  Administered 2012-05-01: 07:00:00 via INTRAVENOUS

## 2012-05-01 ENCOUNTER — Encounter (HOSPITAL_COMMUNITY)
Admission: RE | Disposition: A | Discharge status: Home / Self Care | Payer: Self-pay | Source: Ambulatory Visit | Attending: Cardiology

## 2012-05-01 ENCOUNTER — Ambulatory Visit (HOSPITAL_COMMUNITY)
Admission: RE | Admit: 2012-05-01 | Discharge: 2012-05-01 | Disposition: A | Discharge status: Home / Self Care | Payer: 59 | Source: Ambulatory Visit | Attending: Cardiology | Admitting: Cardiology

## 2012-05-01 DIAGNOSIS — I10 Essential (primary) hypertension: Secondary | ICD-10-CM | POA: Insufficient documentation

## 2012-05-01 DIAGNOSIS — I70219 Atherosclerosis of native arteries of extremities with intermittent claudication, unspecified extremity: Secondary | ICD-10-CM | POA: Insufficient documentation

## 2012-05-01 DIAGNOSIS — N529 Male erectile dysfunction, unspecified: Secondary | ICD-10-CM | POA: Insufficient documentation

## 2012-05-01 DIAGNOSIS — E119 Type 2 diabetes mellitus without complications: Secondary | ICD-10-CM | POA: Insufficient documentation

## 2012-05-01 DIAGNOSIS — I708 Atherosclerosis of other arteries: Secondary | ICD-10-CM | POA: Insufficient documentation

## 2012-05-01 HISTORY — PX: LOWER EXTREMITY ANGIOGRAM: SHX5508

## 2012-05-01 HISTORY — PX: PERCUTANEOUS STENT INTERVENTION: SHX5500

## 2012-05-01 LAB — GLUCOSE, CAPILLARY
Glucose-Capillary: 218 mg/dL — ABNORMAL HIGH (ref 70–99)
Glucose-Capillary: 156 mg/dL — ABNORMAL HIGH (ref 70–99)

## 2012-05-01 LAB — POCT ACTIVATED CLOTTING TIME: Activated Clotting Time: 176 seconds

## 2012-05-01 SURGERY — ANGIOGRAM, LOWER EXTREMITY
Anesthesia: LOCAL

## 2012-05-01 MED ORDER — ONDANSETRON HCL 4 MG/2ML IJ SOLN: 4.0000 mg | Freq: Four times a day (QID) | INTRAMUSCULAR | Status: DC | PRN

## 2012-05-01 MED ORDER — LIDOCAINE HCL (PF) 1 % IJ SOLN
INTRAMUSCULAR | Status: AC
  Filled 2012-05-01: qty 30

## 2012-05-01 MED ORDER — HEPARIN (PORCINE) IN NACL 2-0.9 UNIT/ML-% IJ SOLN
INTRAMUSCULAR | Status: AC
  Filled 2012-05-01: qty 1000

## 2012-05-01 MED ORDER — ACETAMINOPHEN 325 MG PO TABS: 650.0000 mg | ORAL_TABLET | ORAL | Status: DC | PRN

## 2012-05-01 MED ORDER — HYDROMORPHONE HCL PF 2 MG/ML IJ SOLN
INTRAMUSCULAR | Status: AC
  Filled 2012-05-01: qty 1

## 2012-05-01 MED ORDER — SODIUM CHLORIDE 0.9 % IV SOLN: 1.0000 mL/kg/h | INTRAVENOUS | Status: DC

## 2012-05-01 MED ORDER — ONDANSETRON HCL 4 MG/2ML IJ SOLN
INTRAMUSCULAR | Status: AC
  Filled 2012-05-01: qty 2

## 2012-05-01 MED ORDER — MIDAZOLAM HCL 2 MG/2ML IJ SOLN
INTRAMUSCULAR | Status: AC
  Filled 2012-05-01: qty 2

## 2012-05-01 NOTE — Interval H&P Note (Signed)
History and Physical Interval Note:  05/01/2012 9:29 AM  Cory Clarke  has presented today for surgery, with the diagnosis of Claudication  The various methods of treatment have been discussed with the patient and family. After consideration of risks, benefits and other options for treatment, the patient has consented to  Procedure(s): LOWER EXTREMITY ANGIOGRAM (N/A) and possible angioplasty as a surgical intervention .  The patient's history has been reviewed, patient examined, no change in status, stable for surgery.  I have reviewed the patient's chart and labs.  Questions were answered to the patient's satisfaction.     [name]

## 2012-05-01 NOTE — Progress Notes (Signed)
UP AND WALKED AND TOL WELL; RIGHT GROIN STABLE; NO BLEEDING OR HEMATOMA 

## 2012-05-01 NOTE — H&P (Signed)
  Please see office visit notes for complete details of HPI.  

## 2012-05-01 NOTE — CV Procedure (Addendum)
Procedures performed:  1. Right Femoral Arterial access 2. Abdominal aortogram.  3. Crossover from right to the left  femoral artery placement of catheter tip in the left  femoral artery.  4. Left femoral arteriogram with distal runoff 5. PTA and stenting of the Left SFA with Viabahn 6x150 mm Viabahn Covered self expanding stent.  Indication: Claudication, Erectile dysfunction, Abnormal LE arterial duplex suggesting left inflow disease (Left Iliac). H/O prior left SFA PTA.   Peripheral arteriogram:   Femoral arteriogram: Normal right iliac artery with mild calcification and right  common  femoral arteries. Left shows severe calcification of left SFA    99% stenosis of the left SFA proximal to the previously placed Viabahn stent. Three-vessel runoff is noted on left leg below the knee. During balloon angioplasty, a proximal SFA high-grade 99% stenosis was also evident which is noted during balloon angioplasty and it is very resilient.  Abdominal aortogram: This revealed the right aortoiliac bifurcation to be widely patent. Left common iliac artery at the ostium showed a calcified 50-60% stenosis. However there was a 40 mm mercury pressure gradient on pullback across this stenosis. The left internal iliac artery showed mild calcification the ostium with mild disease in the right internal iliac artery showed a high-grade 80% stenosis. The right internal iliac artery was a very large caliber vessel at least measuring 5 if not 6 mm in diameter.  Interventional data: Successful PTA and stenting of 2 high-grade tandem stenosis in the mid and mid to distal SFA. 99% stenosis reduced to 0% with implantation of a 6.0 x 150 mm Viabahn self-expanding covered stent.  TECHNIQUE OF THE  PROCEDURE: Under sterile precautions using a 5-French right femoral arterial access, a 5 [place] Omniflush catheter was advanced  into the desscending aorta and arteriogram was performed.   Recommendation: Do to his erectile  dysfunction, presence of hemodynamically significant left common iliac artery stenosis, right internal iliac artery stenosis, patient will be brought back on elective fashion with a left groin access to perform angioplasty of the left common iliac artery and possibly consider angioplasty of the right internal iliac artery.  Femoral arterial runoff: Left femoral arterial runoff was performed using the same catheter after crossing over from the right  femoral artery into the left femoral artery with the help of a Versacore wire. The catheter tip was positioned into the left external iliac artery and left femoral arteriogram was performed followed by withdrawal of the same cath out of the body. Technique of intervention: Using heparin for anticoagulation and a 7 French  Ansel sheath, sheath was placed into the left femoral artery. Then a long 0.014 Spartacore wire was utilized to cross the stenosis with mild amount of difficulty and the tip of the wire was carefully positioned in the distal SFA. Then we proceeded with balloon angioplasty utilizing a 6.0 x 150 mm mustang balloon, and balloon and plasty was performed at 6 atmospheric pressure x 2. There were 2 areas of high-grade stenosis one at the inflow of the previously placed stent and 1 in the proximal SFA which were high-grade 99% which was not initially appreciated by angiography. The stenosis was very aggressively and and due to patient suffering pain during balloon angioplasty, we withdrew the 6 mm balloon and introduced 5.0 x 20 mm mustang noncompliant balloon and balloon antroplasty was performed at 10 atmospheric pressure x3 for 30 seconds each. This was followed by angiography. The balloon was withdrawn out of the body and after carefully evaluating the lesion,  we decided to proceed with implantation of a 6 by 150 mm Viabahn self-expanding stent under fluoroscopic guidance. The stent was post dilated with a 6.0 by 150 mm mustang balloon at 8 atmospheric  pressure. Following this angiography was repeated and excellent results were confirmed.  The balloon and the wires were withdrawn out of the body and the sheath was gently pulled into the right femoral artery and sutured in place. Patient tolerated the procedure well. There was no immediate complication.

## 2012-05-14 ENCOUNTER — Encounter (HOSPITAL_COMMUNITY): Payer: Self-pay | Admitting: Pharmacy Technician

## 2012-06-05 ENCOUNTER — Encounter (HOSPITAL_COMMUNITY)
Admission: RE | Disposition: A | Discharge status: Home / Self Care | Payer: Self-pay | Source: Ambulatory Visit | Attending: Cardiology

## 2012-06-05 ENCOUNTER — Ambulatory Visit (HOSPITAL_COMMUNITY)
Admission: RE | Admit: 2012-06-05 | Discharge: 2012-06-05 | Disposition: A | Discharge status: Home / Self Care | Payer: 59 | Source: Ambulatory Visit | Attending: Cardiology | Admitting: Cardiology

## 2012-06-05 DIAGNOSIS — N529 Male erectile dysfunction, unspecified: Secondary | ICD-10-CM | POA: Insufficient documentation

## 2012-06-05 DIAGNOSIS — I708 Atherosclerosis of other arteries: Secondary | ICD-10-CM | POA: Insufficient documentation

## 2012-06-05 DIAGNOSIS — I739 Peripheral vascular disease, unspecified: Secondary | ICD-10-CM | POA: Insufficient documentation

## 2012-06-05 LAB — GLUCOSE, CAPILLARY
Glucose-Capillary: 115 mg/dL — ABNORMAL HIGH (ref 70–99)
Glucose-Capillary: 62 mg/dL — ABNORMAL LOW (ref 70–99)
Glucose-Capillary: 64 mg/dL — ABNORMAL LOW (ref 70–99)
Glucose-Capillary: 95 mg/dL (ref 70–99)

## 2012-06-05 LAB — POCT ACTIVATED CLOTTING TIME
Activated Clotting Time: 274 seconds
Activated Clotting Time: 209 seconds

## 2012-06-05 SURGERY — PERCUTANEOUS STENT INTERVENTION

## 2012-06-05 MED ORDER — HEPARIN (PORCINE) IN NACL 2-0.9 UNIT/ML-% IJ SOLN
INTRAMUSCULAR | Status: AC
  Filled 2012-06-05: qty 1000

## 2012-06-05 MED ORDER — CEFAZOLIN SODIUM 1-5 GM-% IV SOLN
INTRAVENOUS | Status: AC
  Filled 2012-06-05: qty 50

## 2012-06-05 MED ORDER — ACETAMINOPHEN 325 MG PO TABS: 650.0000 mg | ORAL_TABLET | ORAL | Status: DC | PRN

## 2012-06-05 MED ORDER — SODIUM CHLORIDE 0.9 % IV SOLN
INTRAVENOUS | Status: DC
  Administered 2012-06-05: 06:00:00 via INTRAVENOUS

## 2012-06-05 MED ORDER — SODIUM CHLORIDE 0.9 % IV SOLN: 1.0000 mL/kg/h | INTRAVENOUS | Status: DC

## 2012-06-05 MED ORDER — MIDAZOLAM HCL 2 MG/2ML IJ SOLN
INTRAMUSCULAR | Status: AC
  Filled 2012-06-05: qty 2

## 2012-06-05 MED ORDER — LIDOCAINE-EPINEPHRINE 1 %-1:100000 IJ SOLN
3.0000 mL | Freq: Once | INTRAMUSCULAR | Status: AC
  Administered 2012-06-05: 6 mL via INTRADERMAL

## 2012-06-05 MED ORDER — ONDANSETRON HCL 4 MG/2ML IJ SOLN: 4.0000 mg | Freq: Four times a day (QID) | INTRAMUSCULAR | Status: DC | PRN

## 2012-06-05 MED ORDER — ONDANSETRON HCL 4 MG/2ML IJ SOLN
INTRAMUSCULAR | Status: AC
  Filled 2012-06-05: qty 2

## 2012-06-05 MED ORDER — LIDOCAINE HCL (PF) 1 % IJ SOLN
INTRAMUSCULAR | Status: AC
  Filled 2012-06-05: qty 30

## 2012-06-05 MED ORDER — LIDOCAINE-EPINEPHRINE 1 %-1:100000 IJ SOLN
INTRAMUSCULAR | Status: AC
  Filled 2012-06-05: qty 1

## 2012-06-05 MED ORDER — HYDROMORPHONE HCL PF 2 MG/ML IJ SOLN
INTRAMUSCULAR | Status: AC
  Filled 2012-06-05: qty 1

## 2012-06-05 MED ORDER — HEPARIN SODIUM (PORCINE) 1000 UNIT/ML IJ SOLN
INTRAMUSCULAR | Status: AC
  Filled 2012-06-05: qty 1

## 2012-06-05 NOTE — CV Procedure (Signed)
Procedures performed: 1. Abdominal aortogram, 2. Crossover from the left common femoral artery into the right common iliac artery and right iliac arteriogram,. PTA and stenting of the left common iliac artery with implantation of a 10.0 x 39 mm Omnilink deployed at 11 atmospheric pressure with reduction of stenosis from 70% to 0% and no pressure gradient post procedure.  Indication: Patient with known peripheral arterial disease 2 complaints of vessel dysfunction and left hip claudication. Patient is able to walk for 5 minutes and has to stop for left hip claudication and rest for 5 minutes before restarting his walk. He had previously undergone bilateral SFA angioplasty. During the procedure, he was found to have iliac, right internal high-grade and left common iliac 70% stenosis with a 20-35 mm pressure gradient. Because of symptoms of erectile dysfunction and claudication he was electively brought to the peripheral angiography suite for planned intervention to his left common iliac artery and possibly right internal iliac artery. After discussion within colleagues, we decided to just stent the left common iliac artery to see if his symptoms of erectile dysfunction would improve. The right internal iliac artery was left alone.  Interventional data successful PTA and stenting of the left common iliac artery with implantation of a 10.0 x 39 mm Omnilink balloon expandable stent deployed at 11 atmospheric pressure. There was a antegrade tear into the left common iliac artery, however, this was stent covered and hence of no hemodynamic consequence.  Recommendation: Hopefully patient will have improvement in both claudication and erectile dysfunction. I will see him back in the office for follow. 187 cc of contrast was utilized for diagnostic and should proceed.  Technique of procedure: I utilized left femoral arterial access and a 7 [place] Ansel sheath was introduced into the left common femoral artery and  advanced into the distal abdominal aorta over a Versacore wire. Abdominal aortogram was performed. A 5 French crossover catheter was utilized to cross from the left femoral artery into the right common iliac artery. Right iliac arteriogram was performed. Then the cath was pulled out of the body and we decided to proceed with intervention to the left common iliac artery. Using 5000 units of heparin and keeping ACT greater than 200, the above said stent balloon was advanced to the site of left common iliac artery and under fluoroscopic and angiographic guidance was deployed at 11 atmospheric pressure for a minute. Angiography revealed excellent results. Because of the antegrade tear, although it was covered with a stent, IV advance the stent balloon back into the distal abdominal aorta keeping part of the balloon into the stent a second inflation was performed at 5 atmospheric pressure for 2 minutes. Repeat angiography revealed no significant change. However it was felt that it was not of any hemodynamic consequence. Hence the lesion was left alone. Left femoral arteriogram was performed through the arterial access sheath and the access was closed with Perclose with excellent hemostasis. Patient tolerated the procedure. This no immediate competitions.

## 2012-06-05 NOTE — H&P (Signed)
  Please see office visit notes for complete details of HPI.  

## 2012-06-05 NOTE — Interval H&P Note (Signed)
History and Physical Interval Note:  06/05/2012 7:43 AM  Cory Clarke  has presented today for surgery, with the diagnosis of Claudication  The various methods of treatment have been discussed with the patient and family. After consideration of risks, benefits and other options for treatment, the patient has consented to  Procedure(s): LOWER EXTREMITY ANGIOGRAM (N/A) and PTA  as a surgical intervention .  The patient's history has been reviewed, patient examined, no change in status, stable for surgery.  I have reviewed the patient's chart and labs.  Questions were answered to the patient's satisfaction.     [name]

## 2012-06-05 NOTE — Progress Notes (Signed)
UP AND WALKED AND TOL WELL AND LEFT GROIN STABLE; NO BLEEDING OR HEMATOMA 

## 2012-07-25 IMAGING — CR DG CHEST 2V
2 series · 2 of 2 positions shown · non-contrast
Comparison: 05/25/2011

CLINICAL DATA: Postop follow-up.

CHEST - 2 VIEW

[w chest pa]
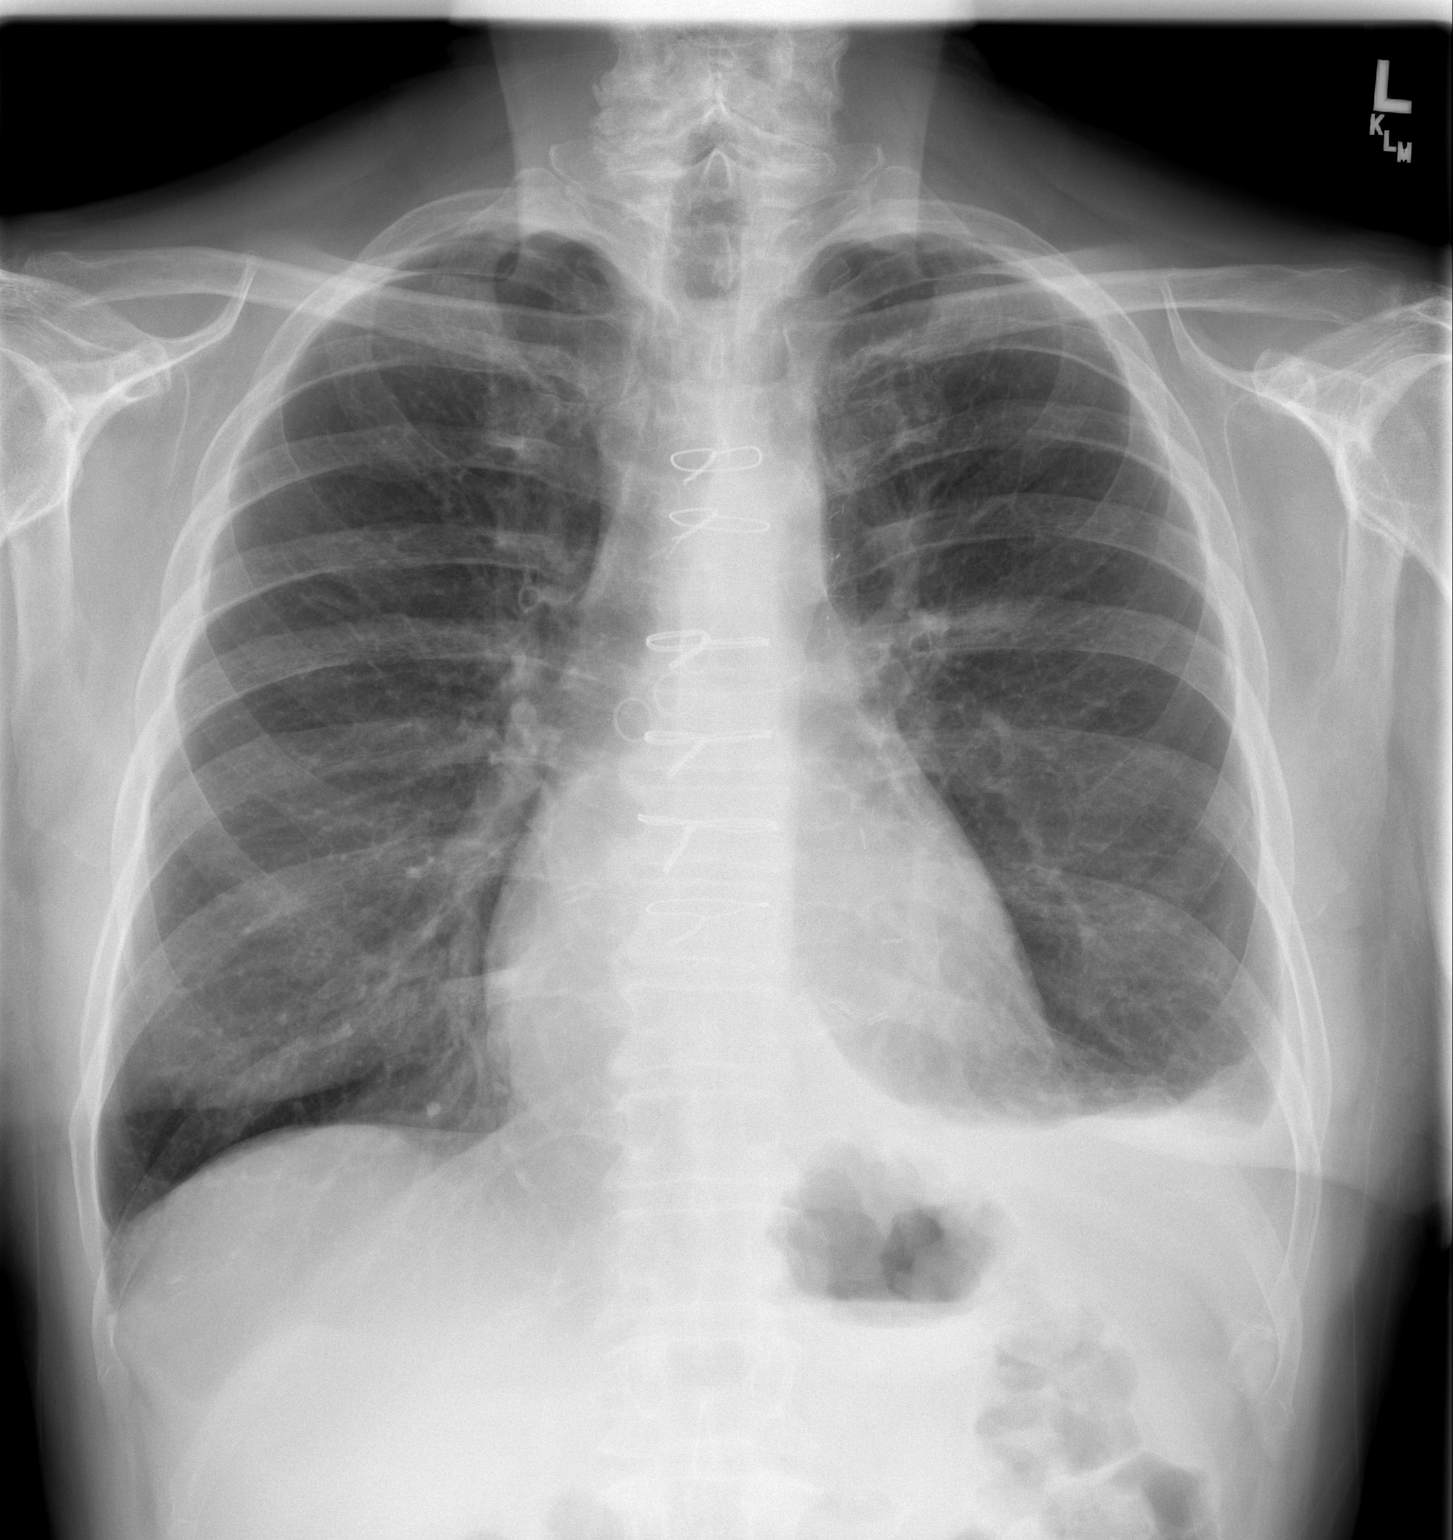

[w chest lat]
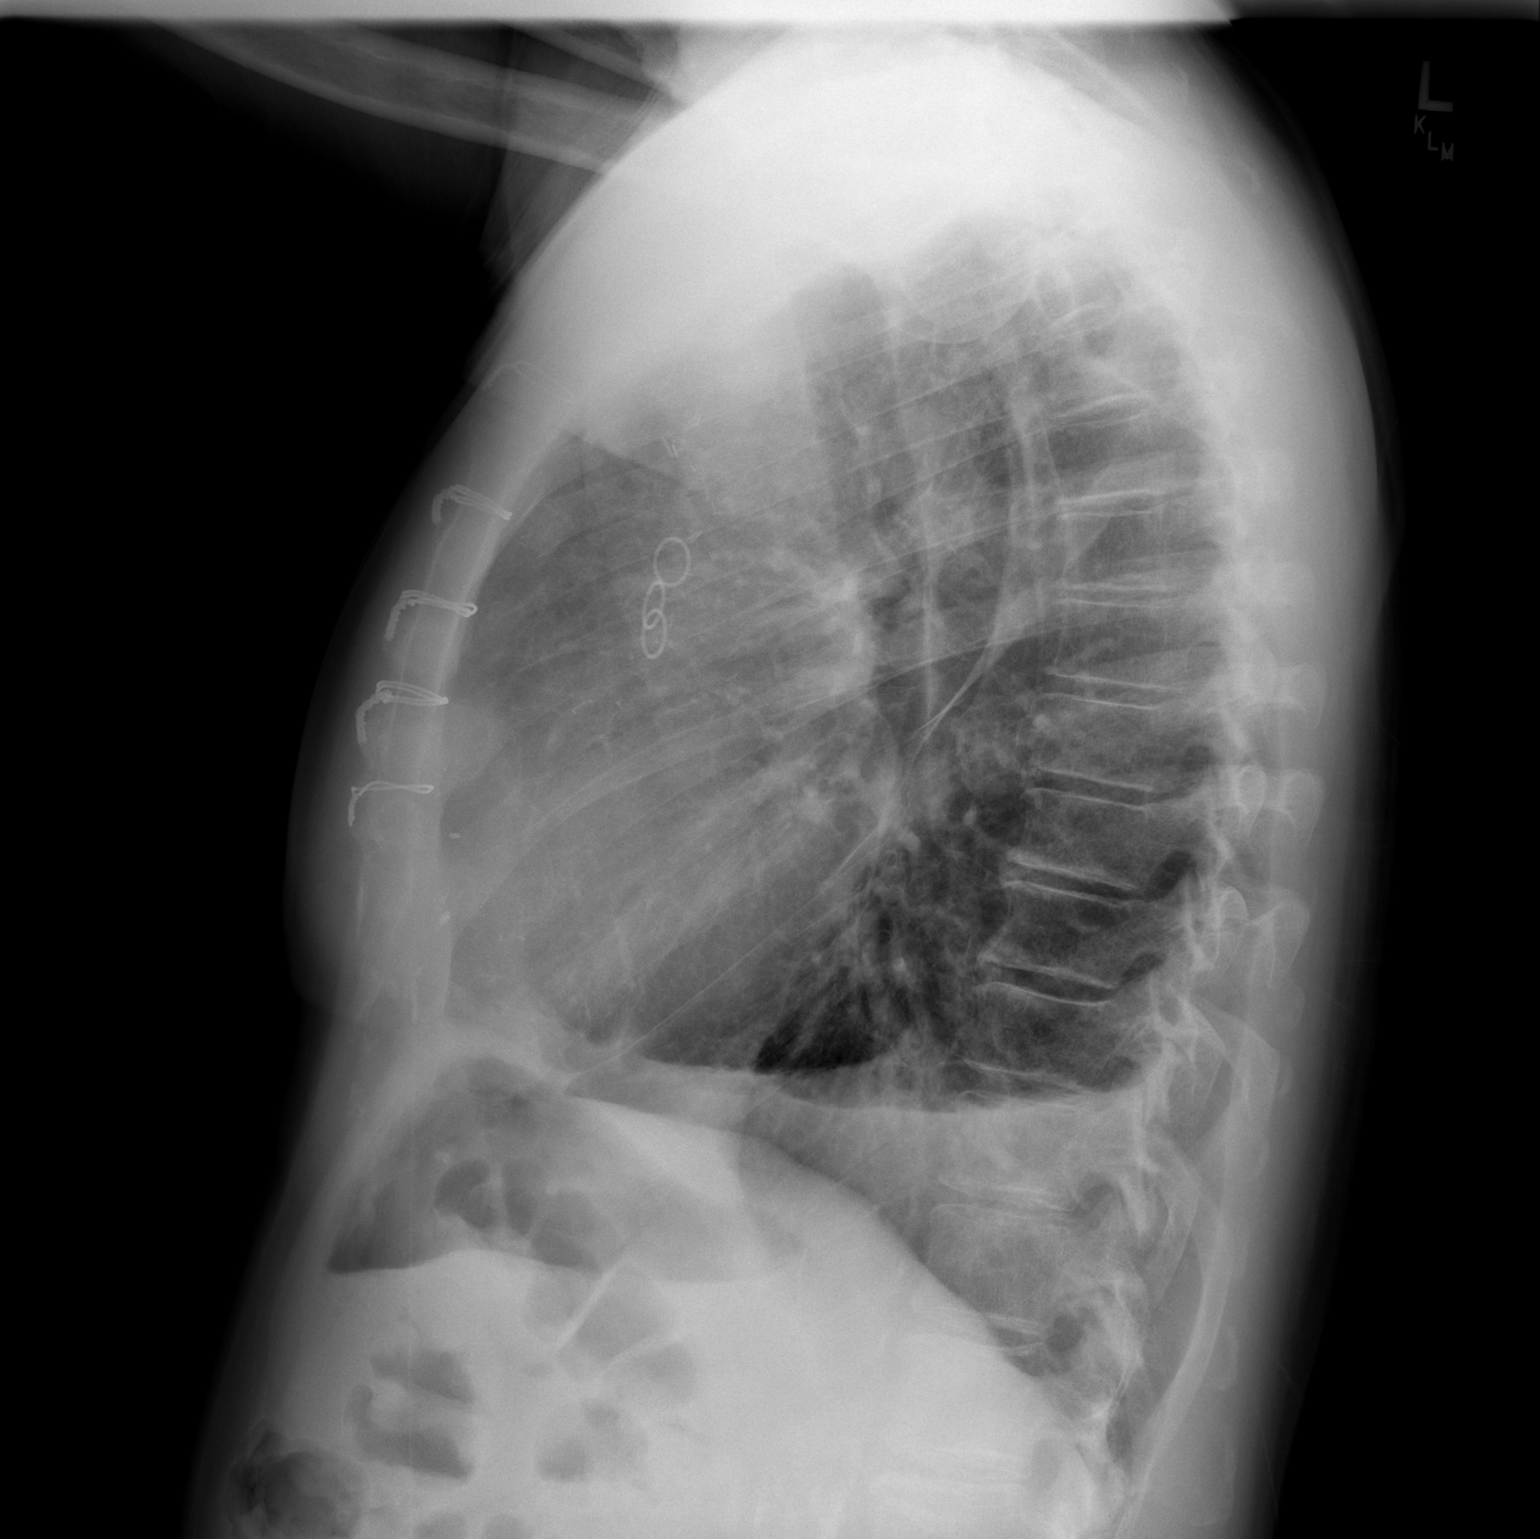

[2 of 2 positions shown; findings below may reference images not displayed]

FINDINGS: Changes of CABG. There is hyperinflation of the lungs
compatible with COPD.  Improving but persistent left basilar
atelectasis and small left effusion.  Right effusion has resolved.
No focal opacity on the right.  Heart is normal size.
IMPRESSION: Postop CABG.

COPD.

Improving but persistent left base atelectasis and small left
effusion.

## 2013-01-23 ENCOUNTER — Encounter (HOSPITAL_COMMUNITY): Payer: Self-pay | Admitting: Pharmacy Technician

## 2013-01-29 ENCOUNTER — Encounter (HOSPITAL_COMMUNITY): Payer: Self-pay | Admitting: General Practice

## 2013-01-29 ENCOUNTER — Encounter (HOSPITAL_COMMUNITY)
Admission: RE | Disposition: A | Discharge status: Home / Self Care | Payer: 59 | Source: Ambulatory Visit | Attending: Cardiology

## 2013-01-29 ENCOUNTER — Ambulatory Visit (HOSPITAL_COMMUNITY)
Admission: RE | Admit: 2013-01-29 | Discharge: 2013-01-30 | Disposition: A | Discharge status: Home / Self Care | Payer: 59 | Source: Ambulatory Visit | Attending: Cardiology | Admitting: Cardiology

## 2013-01-29 DIAGNOSIS — I743 Embolism and thrombosis of arteries of the lower extremities: Secondary | ICD-10-CM | POA: Insufficient documentation

## 2013-01-29 DIAGNOSIS — Y831 Surgical operation with implant of artificial internal device as the cause of abnormal reaction of the patient, or of later complication, without mention of misadventure at the time of the procedure: Secondary | ICD-10-CM | POA: Insufficient documentation

## 2013-01-29 DIAGNOSIS — Z9641 Presence of insulin pump (external) (internal): Secondary | ICD-10-CM | POA: Insufficient documentation

## 2013-01-29 DIAGNOSIS — Z794 Long term (current) use of insulin: Secondary | ICD-10-CM | POA: Insufficient documentation

## 2013-01-29 DIAGNOSIS — E1159 Type 2 diabetes mellitus with other circulatory complications: Secondary | ICD-10-CM | POA: Insufficient documentation

## 2013-01-29 DIAGNOSIS — I7092 Chronic total occlusion of artery of the extremities: Secondary | ICD-10-CM | POA: Insufficient documentation

## 2013-01-29 DIAGNOSIS — I70219 Atherosclerosis of native arteries of extremities with intermittent claudication, unspecified extremity: Secondary | ICD-10-CM | POA: Insufficient documentation

## 2013-01-29 DIAGNOSIS — Z951 Presence of aortocoronary bypass graft: Secondary | ICD-10-CM | POA: Insufficient documentation

## 2013-01-29 DIAGNOSIS — I2510 Atherosclerotic heart disease of native coronary artery without angina pectoris: Secondary | ICD-10-CM | POA: Insufficient documentation

## 2013-01-29 HISTORY — PX: LOWER EXTREMITY ANGIOGRAM: SHX5955

## 2013-01-29 HISTORY — PX: LOWER EXTREMITY ANGIOGRAM: SHX5508

## 2013-01-29 HISTORY — DX: Atherosclerotic heart disease of native coronary artery without angina pectoris: I25.10

## 2013-01-29 LAB — POCT ACTIVATED CLOTTING TIME: Activated Clotting Time: 268 seconds

## 2013-01-29 LAB — GLUCOSE, CAPILLARY
Glucose-Capillary: 170 mg/dL — ABNORMAL HIGH (ref 70–99)
Glucose-Capillary: 295 mg/dL — ABNORMAL HIGH (ref 70–99)

## 2013-01-29 SURGERY — ANGIOGRAM, LOWER EXTREMITY
Anesthesia: LOCAL

## 2013-01-29 MED ORDER — HEPARIN (PORCINE) IN NACL 2-0.9 UNIT/ML-% IJ SOLN
INTRAMUSCULAR | Status: AC
  Filled 2013-01-29: qty 1000

## 2013-01-29 MED ORDER — TICAGRELOR 90 MG PO TABS
ORAL_TABLET | ORAL | Status: AC
  Administered 2013-01-29: 90 mg via ORAL
  Filled 2013-01-29: qty 2

## 2013-01-29 MED ORDER — MIDAZOLAM HCL 2 MG/2ML IJ SOLN
INTRAMUSCULAR | Status: AC
  Filled 2013-01-29: qty 2

## 2013-01-29 MED ORDER — HEPARIN (PORCINE) IN NACL 2-0.9 UNIT/ML-% IJ SOLN
INTRAMUSCULAR | Status: AC
  Filled 2013-01-29: qty 500

## 2013-01-29 MED ORDER — SODIUM CHLORIDE 0.9 % IJ SOLN: 3.0000 mL | Freq: Two times a day (BID) | INTRAMUSCULAR | Status: DC

## 2013-01-29 MED ORDER — TICAGRELOR 90 MG PO TABS
90.0000 mg | ORAL_TABLET | Freq: Two times a day (BID) | ORAL | Status: DC
  Administered 2013-01-29 – 2013-01-30 (×2): 90 mg via ORAL
  Filled 2013-01-29 (×4): qty 1

## 2013-01-29 MED ORDER — RAMIPRIL 5 MG PO CAPS
5.0000 mg | ORAL_CAPSULE | Freq: Every day | ORAL | Status: DC
  Administered 2013-01-29 – 2013-01-30 (×2): 5 mg via ORAL
  Filled 2013-01-29 (×2): qty 1

## 2013-01-29 MED ORDER — ONDANSETRON HCL 4 MG/2ML IJ SOLN: 4.0000 mg | Freq: Four times a day (QID) | INTRAMUSCULAR | Status: DC | PRN

## 2013-01-29 MED ORDER — LIVING WELL WITH DIABETES BOOK
Freq: Once | Status: AC
  Administered 2013-01-29: 20:00:00
  Filled 2013-01-29: qty 1

## 2013-01-29 MED ORDER — HEPARIN SODIUM (PORCINE) 1000 UNIT/ML IJ SOLN
INTRAMUSCULAR | Status: AC
  Filled 2013-01-29: qty 1

## 2013-01-29 MED ORDER — INSULIN PUMP: Freq: Three times a day (TID) | SUBCUTANEOUS | Status: DC

## 2013-01-29 MED ORDER — BIVALIRUDIN 250 MG IV SOLR
INTRAVENOUS | Status: AC
  Filled 2013-01-29: qty 250

## 2013-01-29 MED ORDER — ASPIRIN 81 MG PO CHEW
81.0000 mg | CHEWABLE_TABLET | Freq: Every day | ORAL | Status: DC
  Administered 2013-01-30: 81 mg via ORAL
  Filled 2013-01-29: qty 1

## 2013-01-29 MED ORDER — LIDOCAINE HCL (PF) 1 % IJ SOLN
INTRAMUSCULAR | Status: AC
  Filled 2013-01-29: qty 30

## 2013-01-29 MED ORDER — SODIUM CHLORIDE 0.9 % IV SOLN: 1.0000 mL/kg/h | INTRAVENOUS | Status: AC

## 2013-01-29 MED ORDER — ATORVASTATIN CALCIUM 40 MG PO TABS
40.0000 mg | ORAL_TABLET | Freq: Every day | ORAL | Status: DC
  Administered 2013-01-29: 22:00:00 40 mg via ORAL
  Filled 2013-01-29 (×2): qty 1

## 2013-01-29 MED ORDER — INSULIN ASPART 100 UNIT/ML ~~LOC~~ SOLN
0.0000 [IU] | Freq: Every day | SUBCUTANEOUS | Status: DC
  Administered 2013-01-29: 22:00:00 3 [IU] via SUBCUTANEOUS

## 2013-01-29 MED ORDER — PROPRANOLOL HCL 40 MG PO TABS
40.0000 mg | ORAL_TABLET | Freq: Three times a day (TID) | ORAL | Status: DC
  Administered 2013-01-29 – 2013-01-30 (×2): 40 mg via ORAL
  Filled 2013-01-29 (×5): qty 1

## 2013-01-29 MED ORDER — ONDANSETRON HCL 4 MG/2ML IJ SOLN
INTRAMUSCULAR | Status: AC
  Filled 2013-01-29: qty 2

## 2013-01-29 MED ORDER — SODIUM CHLORIDE 0.9 % IV BOLUS (SEPSIS)
500.0000 mL | Freq: Once | INTRAVENOUS | Status: AC
  Administered 2013-01-29: 07:00:00 via INTRAVENOUS

## 2013-01-29 MED ORDER — ACETAMINOPHEN 325 MG PO TABS: 650.0000 mg | ORAL_TABLET | ORAL | Status: DC | PRN

## 2013-01-29 MED ORDER — VITAMIN B-12 1000 MCG PO TABS
1000.0000 ug | ORAL_TABLET | Freq: Every day | ORAL | Status: DC
  Administered 2013-01-29 – 2013-01-30 (×2): 1000 ug via ORAL
  Filled 2013-01-29 (×2): qty 1

## 2013-01-29 MED ORDER — SODIUM CHLORIDE 0.9 % IV SOLN: INTRAVENOUS | Status: DC

## 2013-01-29 MED ORDER — VERAPAMIL HCL 2.5 MG/ML IV SOLN
INTRAVENOUS | Status: AC
  Filled 2013-01-29: qty 2

## 2013-01-29 MED ORDER — INSULIN ASPART 100 UNIT/ML ~~LOC~~ SOLN: 0.0000 [IU] | Freq: Three times a day (TID) | SUBCUTANEOUS | Status: DC

## 2013-01-29 MED ORDER — INSULIN ASPART 100 UNIT/ML ~~LOC~~ SOLN
0.0000 [IU] | Freq: Three times a day (TID) | SUBCUTANEOUS | Status: DC
  Administered 2013-01-29 – 2013-01-30 (×2): 7 [IU] via SUBCUTANEOUS

## 2013-01-29 MED ORDER — SODIUM CHLORIDE 0.9 % IJ SOLN: 3.0000 mL | INTRAMUSCULAR | Status: DC | PRN

## 2013-01-29 MED ORDER — ALTEPLASE 100 MG IV SOLR
Freq: Once | INTRAVENOUS | Status: DC
  Filled 2013-01-29: qty 100

## 2013-01-29 MED ORDER — SODIUM CHLORIDE 0.9 % IV SOLN: 250.0000 mL | INTRAVENOUS | Status: DC | PRN

## 2013-01-29 MED ORDER — HYDROMORPHONE HCL PF 2 MG/ML IJ SOLN
INTRAMUSCULAR | Status: AC
  Filled 2013-01-29: qty 1

## 2013-01-29 NOTE — Progress Notes (Signed)
Site area: right groin  Site Prior to Removal:  Level 0  Pressure Applied For 30 MINUTES    Minutes Beginning at 1615  Manual:   yes  Patient Status During Pull:  stable  Post Pull Groin Site:  Level 0  Post Pull Instructions Given:  yes  Post Pull Pulses Present:  yes  Dressing Applied:  yes  Comments:

## 2013-01-29 NOTE — H&P (Signed)
  Please see office visit notes for complete details of HPI.  

## 2013-01-29 NOTE — Progress Notes (Signed)
Patient typically wears an insulin pump.  Removed last pm at 2200

## 2013-01-29 NOTE — Interval H&P Note (Signed)
History and Physical Interval Note:  01/29/2013 7:58 AM  Cory Clarke  has presented today for surgery, with the diagnosis of claudication  The various methods of treatment have been discussed with the patient and family. After consideration of risks, benefits and other options for treatment, the patient has consented to  Procedure(s): LOWER EXTREMITY ANGIOGRAM (N/A) and possible PTA as a surgical intervention .  The patient's history has been reviewed, patient examined, no change in status, stable for surgery.  I have reviewed the patient's chart and labs.  Questions were answered to the patient's satisfaction.     [name]

## 2013-01-29 NOTE — CV Procedure (Signed)
Procedure performed: 1. Right femoral arterial access 2. Cross over from the right into the left femoral artery, left femoral arteriogram with distal runoff 3. PTA and thrombectomy with Posis Solent Omni/AngioJet ultra catheter of the left SFA and left popliteal artery 4. Thrombolytic therapy via catheter and pulse spray via AngioJet ultra catheter followed by repeat thrombectomy with AngioJet 5. PTA and balloon angioplasty with a 6.0 x 20 mm Powerflex balloon of the left SFA and left popliteal artery.  Indication Patient is a 62 year old Caucasian male with history of known coronary artery disease and peripheral arterial disease. He did undergone left SFA atherectomy followed by stenting in March of 2014. Recently about to 3 weeks ago he had acute onset of left leg pain, followed by symptoms suggestive of chronic claudication, he underwent lower extremity arterial duplex which had revealed probable occlusion of the left SFA with monophasic waveforms in the left popliteal and distal below the knee vessels. His left eye was more symptomatic than the right. He had similar Doppler signals on the right lower extremity. He is now brought to the peripheral angiography suite to reevaluate his anatomy.  Angiographic data: Left femoral artery distal runoff there was complete occlusion of the entire left SFA and small left popliteal artery. It appeared to be thrombotic. Below the knee, the left anterior tibial was occluded in the proximal segment followed by immediate reconstitution, probably a filling defect in the proximal segment, followed by just above the ankle complete occlusion suggestive of a thrombotic occlusion. The tibial peroneal trunk had high-grade 80-90% stenosis. Mild diffuse disease was evident throughout the the vessels below the knee with diffuse luminal irregularity. There is moderate to marked calcification evident below the knee and also above the knee vessels.  Intervention data: Successful  PTA, thrombolysis followed by thrombectomy with AngioJet and TPA of the left popliteal and left SFA followed by PTA and balloon angioplasty at the areas which were high-grade stenosis, left popliteal artery stenosis, in-stent left mid SFA and in-stent proximal SFA stenosis with a 6.0 x 20 mm Powerflex balloon at 15 atmospheric pressure in the SFA and 3 atmospheric pressure in the popliteal artery for 90 seconds each. Post-PTA results revealed excellent brisk flow and reestablishment of fall three-vessel flow below the left knee. There was very small thrombus burden at the anterior tibial vessel at the level of the dorsum of foot, however excellent result was evident with good blush of the foot.  Recommendation: At this uncontrolled diabetes mellitus, patient will be started on BRILINTA along with aspirin, he will need right lower extremity angiogram due to symptoms of claudication, however he be followed up in the outpatient basis first. He'll be admitted to the hospital for overnight observation and watch for rebleeding competitions.  Technique: Under sterile precautions using a 5 French right femoral arterial access, a 5 [place] crossover catheter was advanced from the right femoral artery into the left femoral artery. With the help of a Versacore wire I was able to cross into the left femoral artery, and angiography was performed.  Technique of intervention: Using initially heparin, then we switched over to Angiomax I utilized a 7 [place] Ansel sheath, 45 cm to cross from the right femoral artery into the left femoral artery. Using CXI angled support 10F catheter, 0.035 inch compatible, 90 cm, I was able to cross the left SFA occlusion and with careful metaplasia of the wire as able to cross into the left antecubital artery with the help of a Glidewire advanced age  0.035 inch glide tip 300 cm wire.  This was followed by thrombectomy with AngioJet catheter. Multiple passes were made throughout the left SFA  and left popliteal artery. During the procedure intra-arterial nitroglycerin and verapamil was also administered. This was followed by repeat angiography which revealed significant amount of thrombus had been extracted, however there was the areas of significant thrombus burden still evident. Hence we decided to proceed with thrombolysis using TPA. Using pulse spray technique with AngioJet catheter, a total of 10 mg of TPA was administered locally into the left SFA proximal, mid and left popliteal artery. We left it to soak for 30 minutes followed by repeat thrombectomy with AngioJet catheter. Intra-arterial nitroglycerin for micrograms administered and angiography was repeated. This time there are 3 focal areas of stenosis that was evident that a high-grade into the proximal SFA at the inflow of the stent, in the midsegment of the stent and the left popliteal artery. These areas were dilated with a 6.0 x 20 mm Powerflex Pro balloon at 15 atmospheric pressure and SFA and 3 atmospheric pressure in the popliteal artery for 90 seconds each followed by repeat angiography.  There was still some mild residual stenosis of 20-30% evident especially in the SFA, however it was heavily calcified and did not feel that additional stenting would help to open up the vessel any further. Given significant calcification throughout the SFA, it was felt that we had achieved adequate results with establishment of three-vessel runoff below the left knee hence the guidewire was withdrawn and the Ansel sheath was withdrawn out of the body and exchanged to a 8 [place] short sheath and sutured in place. The arterial access was not closed due to calcification of the femoral artery on the right. A total of 105 cc of contrast was utilized for diagnostic and interventional procedure. It was a very prolonged procedure taking approximately 3 and half hours.

## 2013-01-30 LAB — CBC
HCT: 37.3 % — ABNORMAL LOW (ref 39.0–52.0)
MCH: 33.9 pg (ref 26.0–34.0)
MCHC: 35.1 g/dL (ref 30.0–36.0)
MCV: 96.6 fL (ref 78.0–100.0)
Platelets: 158 10*3/uL (ref 150–400)
RBC: 3.86 MIL/uL — ABNORMAL LOW (ref 4.22–5.81)
WBC: 6.8 10*3/uL (ref 4.0–10.5)

## 2013-01-30 LAB — BASIC METABOLIC PANEL
BUN: 19 mg/dL (ref 6–23)
CO2: 26 mEq/L (ref 19–32)
Calcium: 8.5 mg/dL (ref 8.4–10.5)
Chloride: 103 mEq/L (ref 96–112)
Creatinine, Ser: 0.97 mg/dL (ref 0.50–1.35)
Potassium: 3.8 mEq/L (ref 3.5–5.1)

## 2013-01-30 MED ORDER — TICAGRELOR 90 MG PO TABS: 90.0000 mg | ORAL_TABLET | Freq: Two times a day (BID) | ORAL | Status: DC

## 2013-01-30 MED FILL — Sodium Chloride IV Soln 0.9%: INTRAVENOUS | Qty: 50 | Status: AC

## 2013-01-31 NOTE — Discharge Summary (Signed)
Physician Discharge Summary  Patient ID: Cory Clarke MRN: [id number] DOB/AGE: 64-Dec-1952 64 y.o.  Admit date: 01/29/2013 Discharge date: 01/30/2013  Primary Discharge Diagnosis Peripheral arterial disease as a complication of diabetes mellitus 2.  Claudication due to peripheral arterial disease  Secondary Discharge Diagnosis CAD, S/P CABG Hypertension Mixed hyperlipidemia  Significant Diagnostic Studies: 01/29/2013: Procedure performed: 1. Right femoral arterial access  2. Cross over from the right into the left femoral artery, left femoral arteriogram with distal runoff  3. PTA and thrombectomy with Posis Solent Omni/AngioJet ultra catheter of the left SFA and left popliteal artery  4. Thrombolytic therapy via catheter and pulse spray via AngioJet ultra catheter followed by repeat thrombectomy with AngioJet  5. PTA and balloon angioplasty with a 6.0 x 20 mm Powerflex balloon of the left SFA and left popliteal artery.    Hospital Course:  Patient has h/o Peripheral arteriogram on 05/01/2012 and repeat arteriogram on 06/05/2012: Left PTA and stenting of right SFA with Viabahn covered stent, left iliac PTA and stenting respectively. Patient has history of PTA and stenting of theleft SFA on 08/16/2011 following atherectomy.he had undergone outpatient lower extremity arterial duplex due to severe worsening of claudication, this is markedly abnormal.  Hence was scheduled for elective peripheral angiography to evaluate further his PAD.  He underwent very complex yet successful PTA with excellent results.  The following morning he was essentially asymptomatic with regard to his left lower extremity except he had felt warmth.  He ambulated without any discomfort.  There was no groin complication.  Hence felt stable for discharge.  Recommendations on discharge: patient will need aggressive continue aggressive modification especially diabetes control.  He still has residual abnormality of his right  lower extremity with symptomatic claudication and depending on his symptoms on outpatient evaluation he may need repeat peripheral angiography for right lower extremity claudication evaluation.  Discharge Exam: Blood pressure 130/85, pulse 90, temperature 98.2 F (36.8 C), temperature source Oral, resp. rate 20, height 5\' 7"  (1.702 m), weight 86.8 kg (191 lb 5.8 oz), SpO2 99.00%.   General: Moderately build and overweight body habitus who is in no acute distress. Appears stated age. Alert Ox3.   There is no cyanosis. HEENT: normal limits. PERRLA, No JVD.   CARDIAC EXAM: S1, S2 normal, no gallop present. No murmur.   CHEST EXAM: No tenderness of chest wall. LUNGS: Clear to percuss and auscultate.  ABDOMEN: No hepatosplenomegaly. BS normal in all 4 quadrants. Abdomen is non-tender.   EXTREMITY: Full range of movementes, No edema. MUSCULOSKELETAL EXAM: Intact with full range of motion in all 4 extremities.   NEUROLOGIC EXAM: Grossly intact without any focal deficits. Alert O x 3.   VASCULAR EXAM: No skin breakdown. Carotids normal. Extremities: Femoral pulse normal, bilateral soft bruit present. Popliteal pulse 2 plus left and absent right, Pedal pulse 2 plus left and absent right.Otherwise No prominent pulse felt in the abdomen. No varicose veins.  Labs:   Lab Results  Component Value Date   WBC 6.8 01/30/2013   HGB 13.1 01/30/2013   HCT 37.3* 01/30/2013   MCV 96.6 01/30/2013   PLT 158 01/30/2013    Recent Labs Lab 01/30/13 0100  NA 138  K 3.8  CL 103  CO2 26  BUN 19  CREATININE 0.97  CALCIUM 8.5  GLUCOSE 185*   FOLLOW UP PLANS AND APPOINTMENTS    Medication List    STOP taking these medications       ALEVE 220 MG tablet  Generic drug:  naproxen sodium     clopidogrel 75 MG tablet  Commonly known as:  PLAVIX      TAKE these medications       aspirin 81 MG chewable tablet  Chew 81 mg by mouth daily.     atorvastatin 40 MG tablet  Commonly  known as:  LIPITOR  Take 40 mg by mouth at bedtime.     insulin pump 100 unit/ml Soln  Inject into the skin. Novolog insulin used in pump.     Krill Oil 1000 MG Caps  Take 1 capsule by mouth every morning.     propranolol 40 MG tablet  Commonly known as:  INDERAL  Take 40 mg by mouth 3 (three) times daily.     ramipril 5 MG capsule  Commonly known as:  ALTACE  Take 5 mg by mouth daily.     Ticagrelor 90 MG Tabs tablet  Commonly known as:  BRILINTA  Take 1 tablet (90 mg total) by mouth 2 (two) times daily.     vitamin B-12 1000 MCG tablet  Commonly known as:  CYANOCOBALAMIN  Take 1,000 mcg by mouth daily.           Follow-up Information   Follow up with [name], MD. (Keep previous appointment)    Specialty:  Cardiology   Contact information:   [address] [place] [address] [phone]        [name], MD 01/31/2013, 8:07 AM  Pager: [phone] Office: [phone] If no answer: [phone]

## 2013-05-20 DIAGNOSIS — I10 Essential (primary) hypertension: Secondary | ICD-10-CM | POA: Diagnosis not present

## 2013-05-20 DIAGNOSIS — E119 Type 2 diabetes mellitus without complications: Secondary | ICD-10-CM | POA: Diagnosis not present

## 2013-05-22 DIAGNOSIS — E782 Mixed hyperlipidemia: Secondary | ICD-10-CM | POA: Diagnosis not present

## 2013-05-22 DIAGNOSIS — I798 Other disorders of arteries, arterioles and capillaries in diseases classified elsewhere: Secondary | ICD-10-CM | POA: Diagnosis not present

## 2013-05-22 DIAGNOSIS — E1159 Type 2 diabetes mellitus with other circulatory complications: Secondary | ICD-10-CM | POA: Diagnosis not present

## 2013-05-22 DIAGNOSIS — I70219 Atherosclerosis of native arteries of extremities with intermittent claudication, unspecified extremity: Secondary | ICD-10-CM | POA: Diagnosis not present

## 2013-05-23 DIAGNOSIS — E7800 Pure hypercholesterolemia, unspecified: Secondary | ICD-10-CM | POA: Diagnosis not present

## 2013-05-23 DIAGNOSIS — I10 Essential (primary) hypertension: Secondary | ICD-10-CM | POA: Diagnosis not present

## 2013-05-23 DIAGNOSIS — E119 Type 2 diabetes mellitus without complications: Secondary | ICD-10-CM | POA: Diagnosis not present

## 2013-05-27 ENCOUNTER — Encounter (HOSPITAL_COMMUNITY): Payer: Self-pay | Admitting: Pharmacy Technician

## 2013-05-30 DIAGNOSIS — Z5181 Encounter for therapeutic drug level monitoring: Secondary | ICD-10-CM | POA: Diagnosis not present

## 2013-05-30 DIAGNOSIS — Z79899 Other long term (current) drug therapy: Secondary | ICD-10-CM | POA: Diagnosis not present

## 2013-05-30 DIAGNOSIS — I70219 Atherosclerosis of native arteries of extremities with intermittent claudication, unspecified extremity: Secondary | ICD-10-CM | POA: Diagnosis not present

## 2013-05-31 DIAGNOSIS — I2510 Atherosclerotic heart disease of native coronary artery without angina pectoris: Secondary | ICD-10-CM | POA: Diagnosis not present

## 2013-05-31 DIAGNOSIS — R079 Chest pain, unspecified: Secondary | ICD-10-CM | POA: Diagnosis not present

## 2013-06-04 ENCOUNTER — Ambulatory Visit (HOSPITAL_COMMUNITY)
Admission: RE | Admit: 2013-06-04 | Discharge: 2013-06-04 | Disposition: A | Discharge status: Home / Self Care | Payer: Medicare Other | Source: Ambulatory Visit | Attending: Cardiology | Admitting: Cardiology

## 2013-06-04 ENCOUNTER — Encounter (HOSPITAL_COMMUNITY)
Admission: RE | Disposition: A | Discharge status: Home / Self Care | Payer: Self-pay | Source: Ambulatory Visit | Attending: Cardiology

## 2013-06-04 DIAGNOSIS — E1165 Type 2 diabetes mellitus with hyperglycemia: Secondary | ICD-10-CM

## 2013-06-04 DIAGNOSIS — I10 Essential (primary) hypertension: Secondary | ICD-10-CM | POA: Diagnosis not present

## 2013-06-04 DIAGNOSIS — I70219 Atherosclerosis of native arteries of extremities with intermittent claudication, unspecified extremity: Secondary | ICD-10-CM | POA: Diagnosis not present

## 2013-06-04 DIAGNOSIS — IMO0001 Reserved for inherently not codable concepts without codable children: Secondary | ICD-10-CM | POA: Insufficient documentation

## 2013-06-04 HISTORY — PX: LOWER EXTREMITY ANGIOGRAM: SHX5508

## 2013-06-04 LAB — GLUCOSE, CAPILLARY
GLUCOSE-CAPILLARY: 153 mg/dL — AB (ref 70–99)
Glucose-Capillary: 128 mg/dL — ABNORMAL HIGH (ref 70–99)

## 2013-06-04 SURGERY — ANGIOGRAM, LOWER EXTREMITY
Anesthesia: LOCAL

## 2013-06-04 MED ORDER — SODIUM CHLORIDE 0.9 % IJ SOLN: 3.0000 mL | Freq: Two times a day (BID) | INTRAMUSCULAR | Status: DC

## 2013-06-04 MED ORDER — SODIUM CHLORIDE 0.9 % IV SOLN
INTRAVENOUS | Status: DC
  Administered 2013-06-04: 06:00:00 via INTRAVENOUS

## 2013-06-04 MED ORDER — FENTANYL CITRATE 0.05 MG/ML IJ SOLN
INTRAMUSCULAR | Status: AC
  Filled 2013-06-04: qty 2

## 2013-06-04 MED ORDER — ASPIRIN 81 MG PO CHEW
81.0000 mg | CHEWABLE_TABLET | ORAL | Status: AC
  Administered 2013-06-04: 81 mg via ORAL
  Filled 2013-06-04: qty 1

## 2013-06-04 MED ORDER — SODIUM CHLORIDE 0.9 % IV SOLN: 250.0000 mL | INTRAVENOUS | Status: DC | PRN

## 2013-06-04 MED ORDER — LIDOCAINE HCL (PF) 1 % IJ SOLN
INTRAMUSCULAR | Status: AC
  Filled 2013-06-04: qty 30

## 2013-06-04 MED ORDER — ATROPINE SULFATE 0.1 MG/ML IJ SOLN
INTRAMUSCULAR | Status: AC
  Filled 2013-06-04: qty 10

## 2013-06-04 MED ORDER — MIDAZOLAM HCL 2 MG/2ML IJ SOLN
INTRAMUSCULAR | Status: AC
  Filled 2013-06-04: qty 2

## 2013-06-04 MED ORDER — SODIUM CHLORIDE 0.9 % IJ SOLN: 3.0000 mL | INTRAMUSCULAR | Status: DC | PRN

## 2013-06-04 MED ORDER — HEPARIN (PORCINE) IN NACL 2-0.9 UNIT/ML-% IJ SOLN
INTRAMUSCULAR | Status: AC
  Filled 2013-06-04: qty 1000

## 2013-06-04 MED ORDER — ONDANSETRON HCL 4 MG/2ML IJ SOLN
INTRAMUSCULAR | Status: AC
  Filled 2013-06-04: qty 2

## 2013-06-04 MED ORDER — SODIUM CHLORIDE 0.9 % IV SOLN: 1.0000 mL/kg/h | INTRAVENOUS | Status: DC

## 2013-06-04 NOTE — CV Procedure (Signed)
Procedures performed:  Right Femoral Arterial access. crossover from  right into the left common  femoral artery placement of catheter tip in the left common femoral artery and  femoral arteriogram with distal runoff  Right  femoral arteriogram distal runoff   throughright femoral arterial access sheath  Indication: Patient is a  63 year old  Caucasianmale with H/O   DM , HTN , Claudication and abnormal LE arterial duplex, patient has history of bilateral SFA stent in 2013, he has had subacute left SFA occlusion and underwent thrombectomy followed by angioplasty November of 2014. He again presented to her office with . subacute left leg pain, left leg cord is in the right, clinical evidence of subacute arterial insufficiency, nonhealing threatening threatening. Due to his significant history, he was now brought to the peripheral angiography suite to evaluate is peripheral anatomy. He also has high-grade stenosis the right SFA from previous angiogram, right femoral arteriogram was performed to evaluate for progression of disease.   Peripheral arteriogram:  Femoral arteriogram: On the left leg, the left iliac arteries showed mild disease. The left SFA is occluded in the proximal segment, it appears to be a thrombotic occlusion. Left profunda femoral artery gives extensive collaterals to the distal left SFA which reconstitutes just above the knee. Next  Below the left knee, there is three-vessel runoff, the left anterior tibial has a proximal high-grade stenosis, however the vessels reach all the way down to the ankle. There was slow flow, posterior tibial vessel is the major vessel.  On the right leg, the right SFA showed mild disease. Right SFA previously placed stents are widely patent except in 2 tandem areas mid and distal SFA there is 80% followed by a 9% stenosis. There is three-vessel runoff below the right knee.  IMPRESSIONS:  1. PAD with symptomatic claudication. Patient was on 4 antiplatelet  agents after his thrombectomy followed angioplasty November of 2014 for his left SFA stenosis. In spite of this patient is occluded the left SFA, almost year and a half after his stent placement. I suspect his ongoing cardiovascular risk factors including uncontrolled diabetes, hypoglycemia due to excessive on-call intake and uncontrolled diabetes is contributing to his thrombotic state. Next  I recommend medical therapy for now. He will need a long left femoropopliteal radial bypass surgery, and durability of this would be questionable, also patient has extensive collaterals. Unless his limb threatening ischemia, he'll probably need below the knee bypass surgery, I did discuss the angiographic data with Dr. Durene Cal. Next  2. With regard right SFA stenosis, he'll need repeat angiography and possible angioplasty, otherwise he probably will have progression of disease as it is high-grade in-stent restenosis in 2 focal areas of the right SFA. This will be performed in a couple weeks.  TECHNIQUE OF PROCEDURE: Under sterile precautions using a 5-French right femoral access a 5 cross over catheter was advanced into the abdominal aorta over a Versacore wire.  Cntralateral cross over to the opposite femoral artery was performed via a 5 [place] end hole catheter which was exchanged after taking the cross over catheter out using Versacore wire. Tip of the wire was placed in the femoral artery and angiogram was performed. All catheter exchanges was performed over the wire.  Femoral arterial runoff Single leg each episode: Right and left femoral arterial runoff was performed using the right femoral arterial access 5 French sheath.  The catheter exchange was done over a Versacore wire. The catheter was then pulled out of the body.  Patient tolerated the procedure well. There was no immediate complication.

## 2013-06-04 NOTE — Interval H&P Note (Signed)
History and Physical Interval Note:  06/04/2013 7:42 AM  Cory Clarke  has presented today for surgery, with the diagnosis of pvd/claudication  The various methods of treatment have been discussed with the patient and family. After consideration of risks, benefits and other options for treatment, the patient has consented to  Procedure(s): LOWER EXTREMITY ANGIOGRAM (N/A) and possible PTA as a surgical intervention .  The patient's history has been reviewed, patient examined, no change in status, stable for surgery.  I have reviewed the patient's chart and labs.  Questions were answered to the patient's satisfaction.     Pamella PertGANJI,JAGADEESH R

## 2013-06-04 NOTE — H&P (Signed)
  Please see office visit notes for complete details of HPI.  

## 2013-06-04 NOTE — Discharge Instructions (Signed)

## 2013-06-04 NOTE — Progress Notes (Signed)
Site area: right groin  Site Prior to Removal:  Level 0  Pressure Applied For 20 MINUTES    Minutes Beginning at 0850  Manual:   yes  Patient Status During Pull:  stable  Post Pull Groin Site:  Level 0  Post Pull Instructions Given:  yes  Post Pull Pulses Present:  yes  Dressing Applied:  yes  Comments:  Pt tolerated sheath pull well. VSS. Will monitor

## 2013-06-11 DIAGNOSIS — I2510 Atherosclerotic heart disease of native coronary artery without angina pectoris: Secondary | ICD-10-CM | POA: Diagnosis not present

## 2013-06-11 DIAGNOSIS — I10 Essential (primary) hypertension: Secondary | ICD-10-CM | POA: Diagnosis not present

## 2013-06-11 DIAGNOSIS — E789 Disorder of lipoprotein metabolism, unspecified: Secondary | ICD-10-CM | POA: Diagnosis not present

## 2013-06-11 DIAGNOSIS — E119 Type 2 diabetes mellitus without complications: Secondary | ICD-10-CM | POA: Diagnosis not present

## 2013-06-12 DIAGNOSIS — E1159 Type 2 diabetes mellitus with other circulatory complications: Secondary | ICD-10-CM | POA: Diagnosis not present

## 2013-06-12 DIAGNOSIS — E782 Mixed hyperlipidemia: Secondary | ICD-10-CM | POA: Diagnosis not present

## 2013-06-12 DIAGNOSIS — I798 Other disorders of arteries, arterioles and capillaries in diseases classified elsewhere: Secondary | ICD-10-CM | POA: Diagnosis not present

## 2013-06-12 DIAGNOSIS — I70219 Atherosclerosis of native arteries of extremities with intermittent claudication, unspecified extremity: Secondary | ICD-10-CM | POA: Diagnosis not present

## 2013-07-02 ENCOUNTER — Encounter (HOSPITAL_COMMUNITY): Payer: Self-pay | Admitting: Pharmacy Technician

## 2013-07-04 DIAGNOSIS — Z951 Presence of aortocoronary bypass graft: Secondary | ICD-10-CM | POA: Diagnosis not present

## 2013-07-04 DIAGNOSIS — I70219 Atherosclerosis of native arteries of extremities with intermittent claudication, unspecified extremity: Secondary | ICD-10-CM | POA: Diagnosis not present

## 2013-07-04 DIAGNOSIS — Z5181 Encounter for therapeutic drug level monitoring: Secondary | ICD-10-CM | POA: Diagnosis not present

## 2013-07-04 DIAGNOSIS — I10 Essential (primary) hypertension: Secondary | ICD-10-CM | POA: Diagnosis not present

## 2013-07-04 DIAGNOSIS — Z01810 Encounter for preprocedural cardiovascular examination: Secondary | ICD-10-CM | POA: Diagnosis not present

## 2013-07-04 DIAGNOSIS — Z01818 Encounter for other preprocedural examination: Secondary | ICD-10-CM | POA: Diagnosis not present

## 2013-07-09 ENCOUNTER — Encounter (HOSPITAL_COMMUNITY)
Admission: RE | Disposition: A | Discharge status: Home / Self Care | Payer: Self-pay | Source: Ambulatory Visit | Attending: Cardiology

## 2013-07-09 ENCOUNTER — Ambulatory Visit (HOSPITAL_COMMUNITY)
Admission: RE | Admit: 2013-07-09 | Discharge: 2013-07-09 | Disposition: A | Discharge status: Home / Self Care | Payer: Medicare Other | Source: Ambulatory Visit | Attending: Cardiology | Admitting: Cardiology

## 2013-07-09 DIAGNOSIS — I10 Essential (primary) hypertension: Secondary | ICD-10-CM | POA: Diagnosis not present

## 2013-07-09 DIAGNOSIS — I70219 Atherosclerosis of native arteries of extremities with intermittent claudication, unspecified extremity: Secondary | ICD-10-CM | POA: Diagnosis not present

## 2013-07-09 DIAGNOSIS — Z87891 Personal history of nicotine dependence: Secondary | ICD-10-CM | POA: Diagnosis not present

## 2013-07-09 DIAGNOSIS — E1159 Type 2 diabetes mellitus with other circulatory complications: Secondary | ICD-10-CM | POA: Diagnosis not present

## 2013-07-09 DIAGNOSIS — I2510 Atherosclerotic heart disease of native coronary artery without angina pectoris: Secondary | ICD-10-CM | POA: Diagnosis not present

## 2013-07-09 DIAGNOSIS — Y831 Surgical operation with implant of artificial internal device as the cause of abnormal reaction of the patient, or of later complication, without mention of misadventure at the time of the procedure: Secondary | ICD-10-CM | POA: Diagnosis not present

## 2013-07-09 DIAGNOSIS — Z9641 Presence of insulin pump (external) (internal): Secondary | ICD-10-CM | POA: Diagnosis not present

## 2013-07-09 DIAGNOSIS — E782 Mixed hyperlipidemia: Secondary | ICD-10-CM | POA: Diagnosis not present

## 2013-07-09 DIAGNOSIS — T82898A Other specified complication of vascular prosthetic devices, implants and grafts, initial encounter: Secondary | ICD-10-CM | POA: Insufficient documentation

## 2013-07-09 DIAGNOSIS — Z951 Presence of aortocoronary bypass graft: Secondary | ICD-10-CM | POA: Diagnosis not present

## 2013-07-09 HISTORY — PX: LOWER EXTREMITY ANGIOGRAM: SHX5508

## 2013-07-09 LAB — POCT ACTIVATED CLOTTING TIME
Activated Clotting Time: 299 seconds
Activated Clotting Time: 232 seconds
Activated Clotting Time: 171 seconds

## 2013-07-09 LAB — GLUCOSE, CAPILLARY
GLUCOSE-CAPILLARY: 123 mg/dL — AB (ref 70–99)
GLUCOSE-CAPILLARY: 75 mg/dL (ref 70–99)
Glucose-Capillary: 99 mg/dL (ref 70–99)

## 2013-07-09 SURGERY — ANGIOGRAM, LOWER EXTREMITY
Anesthesia: LOCAL | Laterality: Right

## 2013-07-09 MED ORDER — HEPARIN SODIUM (PORCINE) 1000 UNIT/ML IJ SOLN
INTRAMUSCULAR | Status: AC
  Filled 2013-07-09: qty 1

## 2013-07-09 MED ORDER — NITROGLYCERIN IN D5W 200-5 MCG/ML-% IV SOLN
INTRAVENOUS | Status: AC
  Filled 2013-07-09: qty 250

## 2013-07-09 MED ORDER — LIDOCAINE HCL (PF) 1 % IJ SOLN
INTRAMUSCULAR | Status: AC
  Filled 2013-07-09: qty 30

## 2013-07-09 MED ORDER — MIDAZOLAM HCL 2 MG/2ML IJ SOLN
INTRAMUSCULAR | Status: AC
  Filled 2013-07-09: qty 2

## 2013-07-09 MED ORDER — SODIUM CHLORIDE 0.9 % IV SOLN
INTRAVENOUS | Status: DC
  Administered 2013-07-09: 06:00:00 via INTRAVENOUS

## 2013-07-09 MED ORDER — SODIUM CHLORIDE 0.9 % IV SOLN: 1.0000 mL/kg/h | INTRAVENOUS | Status: DC

## 2013-07-09 MED ORDER — HYDROMORPHONE HCL PF 1 MG/ML IJ SOLN
INTRAMUSCULAR | Status: AC
  Filled 2013-07-09: qty 1

## 2013-07-09 MED ORDER — HEPARIN (PORCINE) IN NACL 2-0.9 UNIT/ML-% IJ SOLN
INTRAMUSCULAR | Status: AC
  Filled 2013-07-09: qty 1000

## 2013-07-09 NOTE — Interval H&P Note (Signed)
History and Physical Interval Note:  07/09/2013 7:47 AM  Cory Clarke  has presented today for surgery, with the diagnosis of PAD  The various methods of treatment have been discussed with the patient and family. After consideration of risks, benefits and other options for treatment, the patient has consented to  Procedure(s): LOWER EXTREMITY ANGIOGRAM (N/A) and possible PTA  as a surgical intervention .  The patient's history has been reviewed, patient examined, no change in status, stable for surgery.  I have reviewed the patient's chart and labs.  Questions were answered to the patient's satisfaction.     Pamella PertJagadeesh R Sherah Lund

## 2013-07-09 NOTE — H&P (Addendum)
  Please see office visit notes for complete details of HPI.  

## 2013-07-09 NOTE — Discharge Instructions (Signed)
Arteriogram °Care After °These instructions give you information on caring for yourself after your procedure. Your doctor may also give you more specific instructions. Call your doctor if you have any problems or questions after your procedure. °HOME CARE °· Stay in bed the rest of the day. °· Keep your leg straight for at least 6 hours. °· Do not lift anything heavier than 10 pounds (about a gallon of milk) for 2 days. °· Do not walk a lot, run, or drive for 2 days. °· Return to normal activities in 2 days or as told by your doctor. °Finding out the results of your test °Ask when your test results will be ready. Make sure you get your test results. °GET HELP RIGHT AWAY IF:  °· You have fever of 102° F (38.9° C) or higher. °· You have more pain in your leg. °· The leg that was cut is: °· Bleeding. °· Puffy (swollen) or red. °· Cold. °· Pale or changes color. °· Weak. °· Tingly or numb. °If you go to the Emergency Room, tell your nurse that you have had an arteriogram. Take this paper with you to show the nurse. °MAKE SURE YOU: °· Understand these instructions. °· Will watch your condition. °· Will get help right away if you are not doing well or get worse. °Document Released: 05/27/2008 Document Revised: 05/23/2011 Document Reviewed: 05/27/2008 °[employer]® Patient Information ©2014 [employer]. ° °

## 2013-07-09 NOTE — CV Procedure (Addendum)
Procedures performed:  1). Left Femoral Arterial access. 2). Crossover from  Left  into the right  femoral artery placement of catheter tip in the right femoral artery and  femoral arteriogram with distal runoff. 3). PTA and balloon angioplasty of the right SFA for in-stent restenosis, scoring balloon angioplasty with a 5.0 x 100 mm Angiosculpt balloon followed by use of a  4).   Drug-eluting balloon angioplasty with 6.0 x 100 mm Lutonix drug-coated balloon. He  Left femoral arteriogram to assess the arterial access. Manual pressure will be held for achieving hemostasis.  Indication: Patient is a 63 year old male with H/O   DM , HTN , Claudication and abnormal LE arterial duplex. Patient has had peripheral arteriogram a month ago and at that time he was found to have high-grade in-stent restenosis in the right SFA. Because of symptoms of claudication and high-grade instent restenosis, brought to the peripheral angiography Suite for angioplasty of the same.  Peripheral arteriogram:  Right Iliac artery: There was nosignificant stenosis noted.   Right femoral runoff: The right iliac arteries showed mild luminal irregularity. The right SFA showed high-grade in-stent stenosis on midsegment. There was two-vessel runoff below the right knee, mild disease noted below the knee vessels.  Interventional data: Successful PTA and drug-coated balloon angioplasty with 6.0 x 100 mm Lutonix balloon of the midsegment of the right SFA for high-grade in-stent restenosis. Stenosis reduced from 99% to less than 10% with brisk flow.  Technique of the PROCEDURE: Under sterile precautions using a 6-French Ansell 45 cm sheath, left femoral access a 5 cross over catheter was advanced into the abdominal aorta over a Versacore wire.  Contralateral cross over to the opposite femoral artery was performed Tip of the wire was placed in the femoral artery and angiogram was performed. All catheter exchanges was performed over the  wire.  Femoral arterial runoff Single leg each episode: Right  femoral arterial runoff was performed using the 6 [place] Ansell she after crossing over from the left femoral artery into the right he  femoral artery with the help of a Versacore wire. Having performed this, and using for anticoagulation, I utilized a 0.014 [place] core wire and a utilized a Angiosculpt balloon and multiple balloon antiplastic to perform that at peak of 40 Naprosyn pressure for 90 seconds each x6, having performed this excellent result was evident. Because of high risk for restenosis, I then utilized the drug-coated balloon, balloon angioplasty at the highest segment of in-stent restenosis was performed at 12 atmospheric pressure for 45 seconds followed by using the same balloon in the proximal segment at 8 atmospheric pressure for 90 seconds. The balloon was deflated and pulled back out of the body and angiography with runoff was performed. Excellent result with brisk runoff all the way to the tip of his toes was evident. The left femoral arterial access sheath was pulled back into the left femoral artery and left femoral arteriogram was performed to evaluate the arterial access site. Sheath was sutured in place. ACT was maintained at therapeutic range throughout the procedure. Patient will be discharged home today after 6 hours of bedrest. A total of 95 cc of contrast was utilized for diagnostic and interventional procedure.

## 2013-07-16 DIAGNOSIS — IMO0001 Reserved for inherently not codable concepts without codable children: Secondary | ICD-10-CM | POA: Diagnosis not present

## 2013-07-16 DIAGNOSIS — E789 Disorder of lipoprotein metabolism, unspecified: Secondary | ICD-10-CM | POA: Diagnosis not present

## 2013-07-18 DIAGNOSIS — E1159 Type 2 diabetes mellitus with other circulatory complications: Secondary | ICD-10-CM | POA: Diagnosis not present

## 2013-07-18 DIAGNOSIS — I70219 Atherosclerosis of native arteries of extremities with intermittent claudication, unspecified extremity: Secondary | ICD-10-CM | POA: Diagnosis not present

## 2013-07-18 DIAGNOSIS — I2510 Atherosclerotic heart disease of native coronary artery without angina pectoris: Secondary | ICD-10-CM | POA: Diagnosis not present

## 2013-07-18 DIAGNOSIS — E782 Mixed hyperlipidemia: Secondary | ICD-10-CM | POA: Diagnosis not present

## 2013-07-23 DIAGNOSIS — I10 Essential (primary) hypertension: Secondary | ICD-10-CM | POA: Diagnosis not present

## 2013-07-23 DIAGNOSIS — E119 Type 2 diabetes mellitus without complications: Secondary | ICD-10-CM | POA: Diagnosis not present

## 2013-07-23 DIAGNOSIS — I2510 Atherosclerotic heart disease of native coronary artery without angina pectoris: Secondary | ICD-10-CM | POA: Diagnosis not present

## 2013-07-23 DIAGNOSIS — E7800 Pure hypercholesterolemia, unspecified: Secondary | ICD-10-CM | POA: Diagnosis not present

## 2013-07-31 DIAGNOSIS — M25569 Pain in unspecified knee: Secondary | ICD-10-CM | POA: Diagnosis not present

## 2013-08-26 DIAGNOSIS — E119 Type 2 diabetes mellitus without complications: Secondary | ICD-10-CM | POA: Diagnosis not present

## 2013-08-26 DIAGNOSIS — I10 Essential (primary) hypertension: Secondary | ICD-10-CM | POA: Diagnosis not present

## 2013-09-16 DIAGNOSIS — M1710 Unilateral primary osteoarthritis, unspecified knee: Secondary | ICD-10-CM | POA: Diagnosis not present

## 2013-09-23 DIAGNOSIS — E789 Disorder of lipoprotein metabolism, unspecified: Secondary | ICD-10-CM | POA: Diagnosis not present

## 2013-09-23 DIAGNOSIS — E119 Type 2 diabetes mellitus without complications: Secondary | ICD-10-CM | POA: Diagnosis not present

## 2013-09-23 DIAGNOSIS — I10 Essential (primary) hypertension: Secondary | ICD-10-CM | POA: Diagnosis not present

## 2013-10-04 DIAGNOSIS — M25569 Pain in unspecified knee: Secondary | ICD-10-CM | POA: Diagnosis not present

## 2013-12-03 DIAGNOSIS — I70219 Atherosclerosis of native arteries of extremities with intermittent claudication, unspecified extremity: Secondary | ICD-10-CM | POA: Diagnosis not present

## 2013-12-03 DIAGNOSIS — M79609 Pain in unspecified limb: Secondary | ICD-10-CM | POA: Diagnosis not present

## 2013-12-11 DIAGNOSIS — E1159 Type 2 diabetes mellitus with other circulatory complications: Secondary | ICD-10-CM | POA: Diagnosis not present

## 2013-12-11 DIAGNOSIS — I2510 Atherosclerotic heart disease of native coronary artery without angina pectoris: Secondary | ICD-10-CM | POA: Diagnosis not present

## 2013-12-11 DIAGNOSIS — I798 Other disorders of arteries, arterioles and capillaries in diseases classified elsewhere: Secondary | ICD-10-CM | POA: Diagnosis not present

## 2013-12-11 DIAGNOSIS — I70219 Atherosclerosis of native arteries of extremities with intermittent claudication, unspecified extremity: Secondary | ICD-10-CM | POA: Diagnosis not present

## 2013-12-20 ENCOUNTER — Encounter (HOSPITAL_COMMUNITY): Payer: Self-pay | Admitting: Pharmacy Technician

## 2013-12-26 DIAGNOSIS — I70219 Atherosclerosis of native arteries of extremities with intermittent claudication, unspecified extremity: Secondary | ICD-10-CM | POA: Diagnosis not present

## 2013-12-26 DIAGNOSIS — I2510 Atherosclerotic heart disease of native coronary artery without angina pectoris: Secondary | ICD-10-CM | POA: Diagnosis not present

## 2013-12-30 ENCOUNTER — Ambulatory Visit (HOSPITAL_COMMUNITY): Admission: RE | Admit: 2013-12-30 | Payer: Medicare Other | Source: Ambulatory Visit | Admitting: Cardiology

## 2013-12-30 ENCOUNTER — Encounter (HOSPITAL_COMMUNITY): Admission: RE | Payer: Self-pay | Source: Ambulatory Visit

## 2013-12-30 SURGERY — ANGIOGRAM, LOWER EXTREMITY
Anesthesia: LOCAL

## 2013-12-31 ENCOUNTER — Encounter (HOSPITAL_COMMUNITY): Admission: RE | Payer: Self-pay | Source: Ambulatory Visit

## 2013-12-31 ENCOUNTER — Ambulatory Visit (HOSPITAL_COMMUNITY): Admission: RE | Admit: 2013-12-31 | Payer: Medicare Other | Source: Ambulatory Visit | Admitting: Cardiology

## 2013-12-31 SURGERY — ANGIOGRAM, LOWER EXTREMITY
Anesthesia: LOCAL

## 2014-01-23 DIAGNOSIS — I70219 Atherosclerosis of native arteries of extremities with intermittent claudication, unspecified extremity: Secondary | ICD-10-CM | POA: Diagnosis not present

## 2014-01-23 DIAGNOSIS — Z79899 Other long term (current) drug therapy: Secondary | ICD-10-CM | POA: Diagnosis not present

## 2014-01-28 ENCOUNTER — Encounter (HOSPITAL_COMMUNITY)
Admission: RE | Disposition: A | Discharge status: Home / Self Care | Payer: Self-pay | Source: Ambulatory Visit | Attending: Cardiology

## 2014-01-28 ENCOUNTER — Ambulatory Visit (HOSPITAL_COMMUNITY)
Admission: RE | Admit: 2014-01-28 | Discharge: 2014-01-28 | Disposition: A | Discharge status: Home / Self Care | Payer: Medicare Other | Source: Ambulatory Visit | Attending: Cardiology | Admitting: Cardiology

## 2014-01-28 DIAGNOSIS — Z7902 Long term (current) use of antithrombotics/antiplatelets: Secondary | ICD-10-CM | POA: Diagnosis not present

## 2014-01-28 DIAGNOSIS — Z951 Presence of aortocoronary bypass graft: Secondary | ICD-10-CM | POA: Insufficient documentation

## 2014-01-28 DIAGNOSIS — Z87891 Personal history of nicotine dependence: Secondary | ICD-10-CM | POA: Insufficient documentation

## 2014-01-28 DIAGNOSIS — Z7982 Long term (current) use of aspirin: Secondary | ICD-10-CM | POA: Diagnosis not present

## 2014-01-28 DIAGNOSIS — E782 Mixed hyperlipidemia: Secondary | ICD-10-CM | POA: Insufficient documentation

## 2014-01-28 DIAGNOSIS — K219 Gastro-esophageal reflux disease without esophagitis: Secondary | ICD-10-CM | POA: Insufficient documentation

## 2014-01-28 DIAGNOSIS — I10 Essential (primary) hypertension: Secondary | ICD-10-CM | POA: Insufficient documentation

## 2014-01-28 DIAGNOSIS — I2510 Atherosclerotic heart disease of native coronary artery without angina pectoris: Secondary | ICD-10-CM | POA: Insufficient documentation

## 2014-01-28 DIAGNOSIS — Z794 Long term (current) use of insulin: Secondary | ICD-10-CM | POA: Diagnosis not present

## 2014-01-28 DIAGNOSIS — M19012 Primary osteoarthritis, left shoulder: Secondary | ICD-10-CM | POA: Insufficient documentation

## 2014-01-28 DIAGNOSIS — I70212 Atherosclerosis of native arteries of extremities with intermittent claudication, left leg: Secondary | ICD-10-CM | POA: Diagnosis not present

## 2014-01-28 DIAGNOSIS — I70219 Atherosclerosis of native arteries of extremities with intermittent claudication, unspecified extremity: Secondary | ICD-10-CM | POA: Diagnosis not present

## 2014-01-28 DIAGNOSIS — E119 Type 2 diabetes mellitus without complications: Secondary | ICD-10-CM | POA: Insufficient documentation

## 2014-01-28 DIAGNOSIS — I739 Peripheral vascular disease, unspecified: Secondary | ICD-10-CM | POA: Diagnosis present

## 2014-01-28 HISTORY — PX: LOWER EXTREMITY ANGIOGRAM: SHX5508

## 2014-01-28 HISTORY — PX: ABDOMINAL ANGIOGRAM: SHX5499

## 2014-01-28 LAB — GLUCOSE, CAPILLARY
GLUCOSE-CAPILLARY: 208 mg/dL — AB (ref 70–99)
Glucose-Capillary: 105 mg/dL — ABNORMAL HIGH (ref 70–99)
Glucose-Capillary: 173 mg/dL — ABNORMAL HIGH (ref 70–99)

## 2014-01-28 SURGERY — ANGIOGRAM, LOWER EXTREMITY
Anesthesia: LOCAL

## 2014-01-28 MED ORDER — DAPAGLIFLOZIN PRO-METFORMIN ER 5-1000 MG PO TB24: 1.0000 | ORAL_TABLET | Freq: Every day | ORAL | Status: DC

## 2014-01-28 MED ORDER — SODIUM CHLORIDE 0.9 % IV SOLN
INTRAVENOUS | Status: DC
  Administered 2014-01-28: 10:00:00 via INTRAVENOUS

## 2014-01-28 MED ORDER — NITROGLYCERIN 0.2 MG/ML ON CALL CATH LAB
INTRAVENOUS | Status: AC
  Filled 2014-01-28: qty 1

## 2014-01-28 MED ORDER — MIDAZOLAM HCL 2 MG/2ML IJ SOLN
INTRAMUSCULAR | Status: AC
  Filled 2014-01-28: qty 2

## 2014-01-28 MED ORDER — HYDROMORPHONE HCL 1 MG/ML IJ SOLN
INTRAMUSCULAR | Status: AC
  Filled 2014-01-28: qty 1

## 2014-01-28 MED ORDER — SODIUM CHLORIDE 0.9 % IV SOLN: 1.0000 mL/kg/h | INTRAVENOUS | Status: DC

## 2014-01-28 MED ORDER — HYDRALAZINE HCL 20 MG/ML IJ SOLN: 10.0000 mg | INTRAMUSCULAR | Status: DC

## 2014-01-28 MED ORDER — HEPARIN (PORCINE) IN NACL 2-0.9 UNIT/ML-% IJ SOLN
INTRAMUSCULAR | Status: AC
  Filled 2014-01-28: qty 1000

## 2014-01-28 MED ORDER — LIDOCAINE HCL (PF) 1 % IJ SOLN
INTRAMUSCULAR | Status: AC
  Filled 2014-01-28: qty 30

## 2014-01-28 NOTE — Progress Notes (Signed)
7370f sheath removed from rt fem. Artery @ 1300.  Manual pressure applied to rt groin for 20 min.  Dressing applied to site..  No complications.  Palpable Rt. dp pulse.  Rt. Groin level 0.  Post sheath removal instructions given. Pt. Acknowledges and understands.

## 2014-01-28 NOTE — Interval H&P Note (Signed)
History and Physical Interval Note:  01/28/2014 11:51 AM  Cory FindersJoseph P Danese  has presented today for surgery, with the diagnosis of pvd  The various methods of treatment have been discussed with the patient and family. After consideration of risks, benefits and other options for treatment, the patient has consented to  Procedure(s): LOWER EXTREMITY ANGIOGRAM (N/A) possible PTA as a surgical intervention .  The patient's history has been reviewed, patient examined, no change in status, stable for surgery.  I have reviewed the patient's chart and labs.  Questions were answered to the patient's satisfaction.     Pamella PertGANJI,JAGADEESH R

## 2014-01-28 NOTE — H&P (Signed)
Cory Clarke.   Chief Complaint: claudication left leg HPI: Patient is a 63 year old Caucasian Clarke with history of known peripheral arterial disease and coronary artery disease. He has undergone CABG in the past. He has had multiple peripheral angiograms and angioplasty, recent angiography on 06/04/2013 had revealed recurrent thrombotic occlusion of left SFA. Due to recurrent symptoms, he had undergone drug-coated balloon angioplasty to in-stent restenotic right SFA in April 2015. Due to worsening symptoms of claudication, now patient unable to do any activities without severe pain, foot also be cold, ABI being severely restricted, limb threatening ischemia, chronic was felt to be the indication for repeating the angiography. Patient scheduled for L2 peripheral arteriogram and possible angioplasty. He denies any chest pain, shortness of breath, PND or orthopnea. He does have diabetes mellitus which is uncontrolled.  Past Medical History  Diagnosis Date  . Hypertension     under control; has been on med. x 1 yr.  . Arthritis     shoulder and knee  . Diabetes mellitus     IDDM  . Hyperlipemia   . Rotator cuff tear, left     AC joint DJD  . Peripheral vascular disease     blockages in legs  . Abnormal cardiovascular stress test   . Sleep apnea 3-4 YRS AGO    no CPAP - did not return for follow-up after sleep study  . Tuberculosis     + SKIN TEST  . GERD (gastroesophageal reflux disease)     ONCE IN AWHILE  . Coronary artery disease     Past Surgical History  Procedure Laterality Date  . Knee surgery  1980s or 1990s  . Cardiac catheterization      04/11/11+ 04/2011  . Coronary artery bypass graft  05/23/2011    Procedure: CORONARY ARTERY BYPASS GRAFTING (CABG);  Surgeon: Loreli SlotSteven C Hendrickson, MD;  [facility];  Service: Open Heart Surgery;  Laterality: N/A;  Times 5. Using endoscopically harvested right  greater saphenous vein and left internal mammary artery.   .  Lower extremity angiogram  01/29/2013    DR Jacinto HalimGANJI    No family history on file. Social History:  reports that he quit smoking about 2 years ago. His smoking use included Cigarettes. He has a 80 pack-year smoking history. He has never used smokeless tobacco. He reports that he drinks alcohol. He reports that he does not use illicit drugs.  Allergies:  Allergies  Allergen Reactions  . Tape Other (See Comments)    Redness/irritation of skin    Medications Prior to Admission  Medication Sig Dispense Refill  . acetaminophen (TYLENOL) 500 MG tablet Take 1,000 mg by mouth 3 (three) times daily as needed for mild pain or moderate pain.    Marland Kitchen. aspirin 81 MG chewable tablet Chew 81 mg by mouth daily.    Marland Kitchen. atorvastatin (LIPITOR) 40 MG tablet Take 40 mg by mouth at bedtime.     . Cholecalciferol (VITAMIN D PO) Take 1 tablet by mouth daily.    . clopidogrel (PLAVIX) 75 MG tablet Take 75 mg by mouth daily with breakfast.    . Dapagliflozin-Metformin HCl ER (XIGDUO XR) 07-998 MG TB24 Take 1 tablet by mouth daily.    . fenofibrate 54 MG tablet Take 54 mg by mouth daily.    Bess Harvest. Icosapent Ethyl (VASCEPA) 1 G CAPS Take 2 g by mouth 2 (two) times daily.    . Insulin Human (INSULIN PUMP) 100 unit/ml SOLN Inject  1 each into the skin continuous. Novolog insulin used in pump.    Marland Kitchen. propranolol (INDERAL) 40 MG tablet Take 40 mg by mouth 3 (three) times daily.    . ramipril (ALTACE) 5 MG capsule Take 5 mg by mouth daily.    . vitamin B-12 (CYANOCOBALAMIN) 1000 MCG tablet Take 1,000 mcg by mouth daily.      Results for orders placed or performed during the hospital encounter of 01/28/14 (from the past 48 hour(s))  Glucose, capillary     Status: Abnormal   Collection Time: 01/28/14  9:56 AM  Result Value Ref Range   Glucose-Capillary 208 (H) 70 - 99 mg/dL   Comment 1 Notify RN    Comment 2 Documented in Chart    No results found.  Review of Systems  Constitutional: Negative.  Negative for fever, chills and  malaise/fatigue.  HENT: Negative for hearing loss.   Gastrointestinal: Negative.  Negative for abdominal pain and constipation.  Genitourinary: Negative for frequency and hematuria.  Psychiatric/Behavioral: Negative for depression.    Blood pressure 151/87, pulse 68, temperature 97.7 F (36.5 C), temperature source Oral, resp. rate 18, height 5\' 7"  (1.702 m), weight 90.719 kg (200 lb), SpO2 97 %. Physical Exam  Constitutional: He is oriented to person, place, and time. He appears well-developed and well-nourished.  Neck: No JVD present. No thyromegaly present.  Cardiovascular: Regular rhythm and normal heart sounds.   Vascular exam: No skin breakdown. Left foot is colder, capillary refill prolonged. Carotids normal bilaterally. Femoral pulse normal bilaterally, soft bruit present. Popliteal pulse absent left, 2+ right. Pedal pulse absent left, 1+ right. No prominent abdominal aortic pulsation.  Respiratory: Effort normal and breath sounds normal.  GI: Soft. Bowel sounds are normal.  Musculoskeletal: Normal range of motion.  Lymphadenopathy:    He has no cervical adenopathy.  Neurological: He is alert and oriented to person, place, and time.  Skin: Skin is warm.  Psychiatric: He has a normal mood and affect.    Assessment/Plan 1. PAD, symptomatic claudication, chronic critical limb ischemia, left leg. History of angioplasty to his right SFA in the past. 2. History of CABG 05/23/2011, by Dr. Dorris FetchHendrickson, LIMA to LAD, SVG to D1, SVG to RI/OM1. SVG to PDA. Heart catheterization 05/10/2011, occluded RCA, mid LAD 80%, D1 80%, circumflex 70%. 3. Diabetes mellitus type 2 uncontrolled 4. Hyperlipidemia, mixed 5. Hypertension  Recommendation: Patient scheduled for elective peripheral arteriogram and possible angioplasty. All risks, benefits, arteritis including but not limited to limb loss, embolic competition, bleeding, infection, renal failure has been sent to the patient, patient willing to  proceed.  Cory Clarke,Cory Clarke 01/28/2014, 11:39 AM

## 2014-01-28 NOTE — Discharge Instructions (Signed)

## 2014-01-28 NOTE — CV Procedure (Signed)
Procedure performed: Right femoral arterial access, abdominal aortogram with limited bilateral lower extremity femoral arteriogram, cross over from the right femoral artery into the left femoral artery and placement of the catheter tip into the left external iliac artery, left femoral arteriogram with distal runoff.  Indication: Patient is a [id number] Caucasian male with history of left SFA angioplasty in the past and stenting in the past, who was been having worsening symptoms of claudication, he has had prior peripheral arteriogram in March 2015, this had revealed thrombotic occlusion of the left SFA. Due to worsening symptoms of claudication and critical decrease in ABI, he was again brought back to the peripheral angiography suite to evaluate his anatomy for possibility of angioplasty and also surgical revascularization.  Angiographic data: Abdominal aortogram revealed distal abdominal aorta to show moderate amount of atherosclerotic changes, mild to moderate diffuse calcification was evident. The iliac arteries also showed atherosclerotic change. There is a stent in the left common iliac artery which is widely patent. The left and right external and internal iliac arteries and femoral arteries in the proximal segment are all widely patent.  Left femoral artery with distal runoff: The left SFA is occluded at its origin. The SFA is completely occluded all the way outside of the Hunter's canal, the proximal popliteal artery reconstitutes via collaterals from the profunda femoral artery. Below the left knee, there is three-vessel runoff.  Recommendation: Patient has no percutaneous revascularization options, patient with recurrent stent thrombosis and restenosis, would probably benefit from lower except the bypass surgery. However he has dopplerable pulse now compared to previous, I suspect increasing collaterals has improved his ABI has improved from 0.38 when it was performed on 12/03/2013. Hence I will also  discuss with the surgeons to see whether above knee, femoral to popliteal bypass surgery is feasible. Patient has prior CABG, hence autologous veins may be limited and probably continuing medical therapy and saving the bypass in the event of progression of PAD and/or limb threatening ischemia is also another option. I will discuss these with the patient on his office visit.   Technique: Under sterile precautions using a 5 French right femoral arterial access, a 5 French Omni Flush catheter was advanced into the abdominal aorta, limited abdominal aortogram with limited bifemoral aortogram was performed. Then using a crossover catheter, 5 [place] I was able to cross from the right femoral artery into the left femoral artery. A 5 French angled catheter was placed into the left external iliac artery and left femoral arterial damage distal runoff was performed. The catheter was then pulled out of the body and usual fashion. Manual pressure will be held for his hemostasis. A total of 70-80 mL of contrast was utilized for diagnostic angiography. Patient tolerated the procedure well. There was no immediate competitions.

## 2014-02-04 DIAGNOSIS — I10 Essential (primary) hypertension: Secondary | ICD-10-CM | POA: Diagnosis not present

## 2014-02-04 DIAGNOSIS — E1151 Type 2 diabetes mellitus with diabetic peripheral angiopathy without gangrene: Secondary | ICD-10-CM | POA: Diagnosis not present

## 2014-02-04 DIAGNOSIS — I2510 Atherosclerotic heart disease of native coronary artery without angina pectoris: Secondary | ICD-10-CM | POA: Diagnosis not present

## 2014-02-04 DIAGNOSIS — I70219 Atherosclerosis of native arteries of extremities with intermittent claudication, unspecified extremity: Secondary | ICD-10-CM | POA: Diagnosis not present

## 2014-02-05 DIAGNOSIS — E118 Type 2 diabetes mellitus with unspecified complications: Secondary | ICD-10-CM | POA: Diagnosis not present

## 2014-02-12 DIAGNOSIS — I739 Peripheral vascular disease, unspecified: Secondary | ICD-10-CM | POA: Diagnosis not present

## 2014-02-12 DIAGNOSIS — I10 Essential (primary) hypertension: Secondary | ICD-10-CM | POA: Diagnosis not present

## 2014-02-12 DIAGNOSIS — E118 Type 2 diabetes mellitus with unspecified complications: Secondary | ICD-10-CM | POA: Diagnosis not present

## 2014-02-12 DIAGNOSIS — E789 Disorder of lipoprotein metabolism, unspecified: Secondary | ICD-10-CM | POA: Diagnosis not present

## 2014-02-20 ENCOUNTER — Encounter (HOSPITAL_COMMUNITY): Payer: Self-pay | Admitting: Cardiology

## 2014-05-08 DIAGNOSIS — Z125 Encounter for screening for malignant neoplasm of prostate: Secondary | ICD-10-CM | POA: Diagnosis not present

## 2014-05-08 DIAGNOSIS — I10 Essential (primary) hypertension: Secondary | ICD-10-CM | POA: Diagnosis not present

## 2014-05-08 DIAGNOSIS — E118 Type 2 diabetes mellitus with unspecified complications: Secondary | ICD-10-CM | POA: Diagnosis not present

## 2014-05-08 DIAGNOSIS — E780 Pure hypercholesterolemia: Secondary | ICD-10-CM | POA: Diagnosis not present

## 2014-05-12 DIAGNOSIS — E780 Pure hypercholesterolemia: Secondary | ICD-10-CM | POA: Diagnosis not present

## 2014-05-12 DIAGNOSIS — E118 Type 2 diabetes mellitus with unspecified complications: Secondary | ICD-10-CM | POA: Diagnosis not present

## 2014-05-12 DIAGNOSIS — I2510 Atherosclerotic heart disease of native coronary artery without angina pectoris: Secondary | ICD-10-CM | POA: Diagnosis not present

## 2014-05-12 DIAGNOSIS — I10 Essential (primary) hypertension: Secondary | ICD-10-CM | POA: Diagnosis not present

## 2014-06-19 DIAGNOSIS — E119 Type 2 diabetes mellitus without complications: Secondary | ICD-10-CM | POA: Diagnosis not present

## 2014-06-19 DIAGNOSIS — I10 Essential (primary) hypertension: Secondary | ICD-10-CM | POA: Diagnosis not present

## 2014-06-19 DIAGNOSIS — E780 Pure hypercholesterolemia: Secondary | ICD-10-CM | POA: Diagnosis not present

## 2014-06-25 DIAGNOSIS — E789 Disorder of lipoprotein metabolism, unspecified: Secondary | ICD-10-CM | POA: Diagnosis not present

## 2014-06-25 DIAGNOSIS — I10 Essential (primary) hypertension: Secondary | ICD-10-CM | POA: Diagnosis not present

## 2014-06-25 DIAGNOSIS — E118 Type 2 diabetes mellitus with unspecified complications: Secondary | ICD-10-CM | POA: Diagnosis not present

## 2014-07-02 DIAGNOSIS — E119 Type 2 diabetes mellitus without complications: Secondary | ICD-10-CM | POA: Diagnosis not present

## 2014-07-02 DIAGNOSIS — H10413 Chronic giant papillary conjunctivitis, bilateral: Secondary | ICD-10-CM | POA: Diagnosis not present

## 2014-07-02 DIAGNOSIS — H2513 Age-related nuclear cataract, bilateral: Secondary | ICD-10-CM | POA: Diagnosis not present

## 2014-07-16 DIAGNOSIS — I70219 Atherosclerosis of native arteries of extremities with intermittent claudication, unspecified extremity: Secondary | ICD-10-CM | POA: Diagnosis not present

## 2014-07-16 DIAGNOSIS — I2510 Atherosclerotic heart disease of native coronary artery without angina pectoris: Secondary | ICD-10-CM | POA: Diagnosis not present

## 2014-07-16 DIAGNOSIS — E1165 Type 2 diabetes mellitus with hyperglycemia: Secondary | ICD-10-CM | POA: Diagnosis not present

## 2014-07-16 DIAGNOSIS — E782 Mixed hyperlipidemia: Secondary | ICD-10-CM | POA: Diagnosis not present

## 2014-08-12 DIAGNOSIS — I739 Peripheral vascular disease, unspecified: Secondary | ICD-10-CM | POA: Diagnosis not present

## 2014-08-27 DIAGNOSIS — I70219 Atherosclerosis of native arteries of extremities with intermittent claudication, unspecified extremity: Secondary | ICD-10-CM | POA: Diagnosis not present

## 2014-08-27 DIAGNOSIS — Z01818 Encounter for other preprocedural examination: Secondary | ICD-10-CM | POA: Diagnosis not present

## 2014-09-01 MED ORDER — SODIUM CHLORIDE 0.9 % IV SOLN: INTRAVENOUS | Status: DC

## 2014-09-01 NOTE — H&P (Signed)
OFFICE VISIT NOTES COPIED TO EPIC FOR DOCUMENTATION  Cory Clarke 07/16/2014 2:02 PM Location: [facility] Patient #: [id number] DOB: Jul 03, 1950 Married / Language: [name] / Race: White Male    History of Present Illness([name], MD; 07/16/2014 6:04 PM) Patient words: 6 Mth F/U; Pt c/o having leg pain in both legs. Pt brought a form to get a Handicap Placard  The patient is a 64 year old male who presents for a follow-up for Peripheral vascular disease. The last clinic visit was 6 month(s) ago. No changes in management were made at the last visit. Symptoms include exertional leg pain, leg fatigue and leg cramps, while symptoms do not include resting leg pain. Pain is located in the left calf, left foot, right calf and right foot. The patient describes the pain as cramping. Onset was gradual 5 month(s) ago (Patient has had chronic claudication in his left lower extremity, but in the past 4 months has noticed similar claudication in his right leg.). The patient describes this as moderate in severity. Symptoms are exacerbated by exercise and walking. Symptoms are relieved by resting for a few minutes. Associated symptoms include weakness, while associated symptoms do not include cold intolerance, numbness, skin discoloration, non-healing skin ulcers, chest pain, dyspnea or paroxysmal nocturnal dyspnea.  Current treatment includes aspirin, cilostazol (Pletal) (and Zontivity) and lipid lowering agents. By report there is good compliance with treatment. Initial diagnosis of peripheral vascular disease was 3 year(s) ago. Since diagnosis the disease has been worsening. Recently the disease has been worsening (symptoms has now started in the right calf. ). Disease complications do not include recurrent ischemic ulcers or gangrene. Past treatment has included angioplasty (Peripheral arteriogram on 11/70/2015 revealed patent left iliac artery stenting placed in 2014, left  SFA was long segment occlusion with significant thrombus burden in the Viabahn stents placed in 2014. 3 vessel runoff below the left leg. History of right SFA stenting in 2013, restenosis and recorded balloon angioplasty on 07/09/2013.).  Additional reason for visit:  Coronary artery diseaseis described as the following: The last clinic visit was 6 month(s) ago. Symptoms do not include chest pain, shortness of breath, paroxysmal nocturnal dyspnea or dyspnea with exertion. The patient has a surgical history of coronary bypass procedure (H/O CABG 05/23/11 by Dr. Roxan Hockey: LIMA to LAD, SVG to D1, SVG to RI/OM1, SVG to PDA.: Heart cath 05/10/11 : Occluded RCA, Mid LAD 80%, D1 80%, Cx 70% mid).    Problem List/Past Medical([name]; 07/16/2014 2:03 PM) Mixed hyperlipidemia (E78.2). Labs 05/08/2014: total cholesterol 153, triglycerides 284, HDL 54, LDL 42  Labs 08/26/2013: Total cholesterol 162, triglycerides 331, HDL 53, LDL 43. BUN 18, serum creatinine 1.1. CMP otherwise normal. Lipid profile 05/20/2013: Total cholesterol 148, triglycerides 448, HDL 51, LDL could not be calculated. Benign essential HTN (I10). Labs 05/08/2014: CBC normal, serum glucose 153, creatinine 1.1, potassium 4.9, CMP otherwise normal  Labs 05/20/2013: HB 15.1/HCT 44.5. WBC 7.5, platelet count 166,000. CMP was normal with a serum creatinine of 0.85 and eGFR 92 mL. Diabetes mellitus type 2, uncontrolled (E11.65). Labs 05/08/2014: HbA1c 6.7%  Now on insulin pump. HbA1c 05/20/2013: 7.4%.  Labs 08/26/2013: HbA1c 6.6%. 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 Patient scheduled for peripheral angiogram  Labs 01/23/2014: CBC normal, creatinine 1.03, eGFR 76, BMP normal, PT 9.7, INR 0.9, aPTT 29  Labs 12/26/2013: HB 15.3/HCT 43.3, CBC normal, serum glucose 117, creatinine 1.0, BMP otherwise normal, PT 9.8, INR 1.0,  aPTT 29 Atherosclerosis of  native coronary artery of native heart without angina pectoris (I25.10). CAD/ASHD H/O CABG 05/23/11 by Dr. Roxan Hockey: LIMA to LAD, SVG to D1, SVG to RI/OM1, SVG to PDA.: Heart cath 05/10/11 : Occluded RCA, Mid LAD 80%, D1 80%, Cx 70% mid.  Lexiscan sestamibi stress test 05/31/2013: 1. Resting EKG NSR. Stress EKG was non diagnostic for ischemia. No significant ST-T changes of ischemia noted with pharmacologic stress testing. Stress symptoms included shortness of breath, headache, and lightheadedness. Stress terminated due to completion of protocol. 2. The perfusion study demonstrated normal isotope uptake both at rest and stress. There was no evidence of ischemia or scar. There was mild apical thinning. Dynamic gated images reveal normal wall motion and endocardial thickening. Left ventricular ejection fraction was estimated to be 59%. This is a low risk stress test. Diabetes mellitus with peripheral artery disease (E11.51). Labs 2/12/ 2014: Marland Kitchen HbA1c 9.3%. HbA1c 05/20/2013: 7.4%. 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 Peripheral arteriogram 01/28/2014 No change from 06/04/2013: Left SFA is occluded. Appears chronic. No percutaneous option. Three-vessel runoff below the left knee. Medical therapy recommended unless limb threatening ischemia.  PV angio 07/09/2013: Scoring balloon angioplasty followed by Lutonix drug-eluting balloon angioplasty right SFA in-stent restenosis stenosis.  Peripheral arteriogram on 05/01/2012 and repeat arteriogram on 06/05/2012: Left PTA and stenting of left SFA with Viabahn covered stent, left iliac PTA and stenting respectively. Patient has history of PTA and stenting of the right SFA on 08/16/2011 following atherectomy. Diabetes Type 2. Hypertension. Past history of hyperlipidemia. Poor circulation in the legs.    Allergies([name]; Jul 19, 2014 2:03 PM) No Known Drug  Allergies. 05/10/2012    Family History(April Garrison; 19-Jul-2014 2:12 PM) Mother. Deceased. at age 27 from Cirrhosis of the Liver Father. Deceased. in his early 48's from Choking on a piece of meat Brother 2. Older; no known heart conditions    Social History(April Garrison; 2014-07-19 2:12 PM) Marital status. Married. Alcohol Use. Occasional alcohol use. Current tobacco use. Former smoker. quit 05/2011 Number of Children. 3. Living Situation. Lives with spouse.    Past Surgical History([name]; 07/19/14 2:13 PM) Orthoscopic surgery on left knee in late 80's. CAD S/P CABG 05/23/11 by Dr. Roxan Hockey: LIMA to LAD, SVG to D1, SVG to RI/OM1, SVG to PDA(native RCA occluded). Peripheral arteriogram on 05/10/2011 Short segment bilateral SFA occlusion. Severe calcification throughout the SFA. There was three-vessel runoff below the knee bilaterally.    Medication History([name]; 2014-07-19 2:16 PM) Clopidogrel Bisulfate (75MG Tablet, 1 (one) Tablet Oral daily, Taken starting 07/07/2014) Active. Propranolol HCl (40MG Tablet, 1 (one) Oral three times daily, Taken starting 01/08/2014) Active. Zontivity (2.08MG Tablet, 1 (one) Tablet Oral daily, Taken starting 06/26/2014) Active. Fenofibrate (54MG Tablet, 1 (one) Tablet Oral daily after dinner, Taken starting 06/11/2014) Active. NovoLOG (100UNIT/ML Solution, 40 Subcutaneous continuous pump, Taken 06/12/2012 to 07/11/2014) Discontinued: taking Levemir. Xigduo XR (5-1000MG Tablet ER 24HR, 1 Oral two times daily) Active. (diabetes) Vascepa (1GM Capsule, 1 Oral two times daily) Active. V-Go 40 (1 daily, Stopped taking 07/11/2014) Discontinued: taking Levemir. OneTouch Ultra Blue (4 In Vitro daily) Active. Aspirin Low Strength (81MG Tablet Chewable, 1 Oral daily) Active. Lipitor (40MG Tablet, 1 Oral at bedtime) Active. Ramipril (5MG Capsule, 1 Oral in the evening) Active. Vitamin D3 (2000UNIT Tablet, 1 Oral  four times daily) Active. Medications Reconciled. Levemir (100UNIT/ML Solution, 30 Units Subcutaneous daily) Active.    Diagnostic Studies History([name]; 2014/07/19 2:13 PM) Nuclear stress test.  Lexiscan sestamibi stress test 05/31/2013: 1. Resting EKG NSR. Stress EKG was non diagnostic for ischemia. No significant ST-T changes of ischemia noted with pharmacologic stress testing. Stress symptoms included shortness of breath, headache, and lightheadedness. Stress terminated due to completion of protocol. 2. The perfusion study demonstrated normal isotope uptake both at rest and stress. There was no evidence of ischemia or scar. There was mild apical thinning. Dynamic gated images reveal normal wall motion and endocardial thickening. Left ventricular ejection fraction was estimated to be 59%. This is a low risk stress test. CAD/ASHD: CAD/ASHD H/O CABG 05/23/11 by Dr. Roxan Hockey: LIMA to LAD, SVG to D1, SVG to RI/OM1, SVG to PDA.: Heart cath 05/10/11 : Occluded RCA, Mid LAD 80%, D1 80%, Cx 70% mid. Doing well and on appropriate medical therapy. No angina, dysp Arteriogram femoral (59935). Peripheral arteriogram 06/04/2013: Left SFA is occluded, appears thrombotic. Three-vessel runoff below the left knee. Long segment occlusion. Discussed with vascular surgeons. Medical therapy recommended unless limb threatening ischemia. Patient has excellent collaterals from the profunda femoral artery. Posterior tibial is the major vessel. Left anterior tibial proximal 90% stenosis.  High-grade stenosis of the right SFA in-stent restenosis, needs angioplasty. High risk for progression of disease and occlusion.  Peripheral arteriogram on 05/01/2012 and repeat arteriogram on 06/05/2012: Left PTA and stenting of right SFA with Viabahn covered stent, left iliac PTA and stenting respectively. Patient has history of PTA and stenting of the right SFA on 08/16/2011 following atherectomy. Lower Extremity Dopplers.  Lower extremity arterial duplex 01/07/2013: Lower extremity arterial duplex suggests bilateral diffuse disease, moderately decreased bilateral ABI. Study suggest occlusion of the left distal SFA. Right distal SFA velocity suggest greater than 50% stenosis. Monophasic waveforms suggest bilateral diffuse SFA and below the knee disease. Compared to the study done on 07/02/2012, there is decrease in ABI from 0.93 on the right and 0.96 on the left could not be obtained, study suggest significant progression of vascular disease in both the lower extremities. Consider further workup if clinically indicated.   LE arterial duplex 07/02/12: No hemodynamically significant stenosis is identified on either side. This exam reveals mildly decreased perfusion of both the lower extremities noted at the post tibial artery level. LABI 0.96 and RABI 0.93. Compared to study done on 03/12/12, LABI 0.41 and RABI 0.69. Study suggests successful bilateral SFA revascularization. Recheck in 6 months.   LE arterial duplex 03/12/12: 1. No hemodynamically significant stenosis is identified on either side. Left EIA velocity decreased and may suggest inflow disease. The previously placed stents in both the SFA appear patent. 2. This exam reveals moderately decreased perfusion of the right lower extremity, with RABI of 0.69 noted at the dorsalis pedis artery level and severely decreased LABI of 0.41 of the left lower extremity, noted at the dorsalis pedis artery level. 3. Compared to 01/17/11: RABI 0.57 and LABI 0.43 with the suggestion of bilateral SFA disease. Present study suggests improved wave form right SFA (previously monophasic), left inflow disease is new. dyspnea or CHF on exam. Continue present medical therapy. PV Cath 08/16/11: PTA/Stent 6x150 mm Viabahn covered stent to Left SFA. 100 to 0%. Has residual right SFA occlusion. ECG 07/25/11: NSR @ 72/min. Normal intervals. No ischemia. Normal EKG. LE arterial duplex pre  angioplasty on 01/17/11; RABI 0.53, LABI 0.47 suggesting severe PAD. PV CATH (free text). 07/09/2013 Lower Extremity Doppler:. 12/03/2013 1. Biphasic waveforms of the right lower extremity suggests mild diffuse disease and patent SFA stents. 2. Biphasic waveforms of the left external iliac,  common femoral and profunda femoral arteries suggests mild diffuse disease. Left proximal superficial femoral, mid superficial femoral and distal superficial femoral arteries appears occluded. Abnormal (monophasic) waveforms of the left mid popliteal, posterior tibial and anterior tibial arteries suggests proximal occlusion or significant stenosis (popliteal filled by collaterals). 3. Mildly decreased right resting ABI of 0.87. Severely decreased left resting ABI 0.38. Consider further work-up and study suggests reocclusion of left SFA stents. Colonoscopy. 2006    Other Problems([name]; 07/16/2014 2:03 PM) Pre-operative cardiovascular examination (Z01.810)    Review of Systems([name], MD; 07/16/2014 6:04 PM) General:Not Present- Tiredness and Unable to Sleep Lying Flat. HEENT:Not Present- Blurred Vision. Respiratory:Not Present- Difficulty Breathing on Exertion and Wakes up from Sleep Wheezing or Short of Breath. Cardiovascular:Present- Claudicationsand Leg Cramps. Not Present- Edema, Palpitations and Paroxysmal Nocturnal Dyspnea. Gastrointestinal:Not Present- Black, Tarry Stool, Bloody Stool and Heartburn. Musculoskeletal:Present- Leg Weakness(left leg weakness and discomfort with activity). Not Present- Joint Pain. Neurological:Not Present- Focal Neurological Symptoms. Psychiatric:Not Present- Suicidal Ideation and Personality Changes. Hematology:Not Present- Blood Clots, Easy Bruising and Nose Bleed.    Vitals([name], MD; 07/16/2014 3:06 PM) 07/16/2014 2:04 PM Weight: 196 lb Height: 67 in Body Surface Area: 2.05 m Body Mass Index: 30.7  kg/m Pulse: 67 (Regular) P.OX: 95% (Room air) BP: 134/72 (Sitting, Left Arm, Standard)     Physical Exam([name], MD; 07/16/2014 3:04 PM) The physical exam findings are as follows:   General Mental Status- Alert. General Appearance- Cooperative and Appears stated age. Not in acute distress. Orientation- Oriented X3. Build & Nutrition- Well built.   Head and Neck Thyroid Gland Characteristics- no palpable nodules. no palpable enlargement.   Chest and Lung Exam Palpation:Tender- No chest wall tenderness. Auscultation: Breath sounds:- Clear.   Cardiovascular Inspection:Jugular vein- Right- No Distention. Auscultation:Heart Sounds- S1 WNL, S2 WNL and No gallop present. Murmurs & Other Heart Sounds: Murmur:- No murmur.   Abdomen Palpation/Percussion:Palpation and Percussion of the abdomen reveal - Non Tender and No hepatosplenomegaly. Auscultation:Auscultation of the abdomen reveals - Bowel sounds normal.   Peripheral Vascular Lower Extremity:Inspection- Left- No Pigmentation or Varicose veins. Right- No Pigmentation or Varicose veins. Palpation:Temperature- Bilateral- Cool. Edema- Left- No edema. Right- No edema. Femoral pulse- Bilateral- 2+. Popliteal pulse- Bilateral- Absent. Dorsalis pedis pulse- Bilateral- Absent. Posterior tibial pulse- Bilateral- Absent. Carotid arteries- Left- No Carotid bruit. Right- No Carotid bruit. Abdomen- No prominent abdominal aortic pulsation or epigastric bruit.   Neurologic Motor:- Grossly intact without any focal deficits.   Musculoskeletal Global Assessment Left Lower Extremity - normal range of motion without pain. Right Lower Extremity- normal range of motion without pain.    Assessment & Plan(Beverly Gill; 07/16/2014 3:27 PM) Atherosclerosis of native coronary artery of native heart without angina pectoris (I25.10) Story: CAD/ASHD H/O CABG 05/23/11 by Dr.  Roxan Hockey: LIMA to LAD, SVG to D1, SVG to RI/OM1, SVG to PDA.: Heart cath 05/10/11 : Occluded RCA, Mid LAD 80%, D1 80%, Cx 70% mid.  Lexiscan sestamibi stress test 05/31/2013: 1. Resting EKG NSR. Stress EKG was non diagnostic for ischemia. No significant ST-T changes of ischemia noted with pharmacologic stress testing. Stress symptoms included shortness of breath, headache, and lightheadedness. Stress terminated due to completion of protocol. 2. The perfusion study demonstrated normal isotope uptake both at rest and stress. There was no evidence of ischemia or scar. There was mild apical thinning. Dynamic gated images reveal normal wall motion and endocardial thickening. Left ventricular ejection fraction was estimated to be 59%. This is a low  risk stress test. Impression: EKG 07/16/2014: Normal sinus rhythm at rate of 61 bpm, normal intervals. T wave inversion anterior leads and lateral leads, cannot exclude anterolateral ischemia. No change since EKG 12/11/2013. Current Plans l Complete electrocardiogram (93000)  Atherosclerosis of native arteries of the extremities with intermittent claudication (R56.153) Story: Peripheral arteriogram 01/28/2014 No change from 06/04/2013: H/O left iliac stenting 2014. Left SFA is occluded. Appears chronic and long thrombotic lesion (h/o Viabahn stents 2014). No percutaneous option. Three-vessel runoff below the left knee. Medical therapy recommended unless limb threatening ischemia.  PV angio 07/09/2013: Lutonix drug-eluting balloon angioplasty right SFA in-stent restenosis stenosis placed on 08/16/2011. Impression: Lower extremity arterial duplex 12/03/2013: 1. Biphasic waveforms of the right lower extremity suggests mild diffuse disease and patent SFA stents. 2. Biphasic waveforms of the left external iliac, common femoral and profunda femoral arteries suggests mild diffuse disease. Left proximal superficial femoral, mid superficial femoral and distal superficial  femoral arteries appears occluded. Abnormal (monophasic) waveforms of the left mid popliteal, posterior tibial and anterior tibial arteries suggests proximal occlusion or significant stenosis (popliteal filled by collaterals). 3. Mildly decreased right resting ABI of 0.87. Severely decreased left resting ABI 0.38. Consider further work-up and study suggests reocclusion of left SFA stents. Future Plans l 08/12/2014: LE arterial dopplers (79432) - one time  Mixed hyperlipidemia (E78.2) Story: Labs 05/08/2014: total cholesterol 153, triglycerides 284, HDL 54, LDL 42  Labs 08/26/2013: Total cholesterol 162, triglycerides 331, HDL 53, LDL 43. BUN 18, serum creatinine 1.1. CMP otherwise normal. Lipid profile 05/20/2013: Total cholesterol 148, triglycerides 448, HDL 51, LDL could not be calculated. Impression: Labs 05/20/2013: HB 15.1/HCT 44.5. WBC 7.5, platelet count 166,000. CMP was normal with a serum creatinine of 0.85 and eGFR 92 mL.  Diabetes mellitus type 2, uncontrolled (E11.65) Story: Labs 05/08/2014: HbA1c 6.7%. 05/20/2013: 7.4%.  Benign essential HTN (I10) Story: Labs 05/08/2014: CBC normal, serum glucose 153, creatinine 1.1, potassium 4.9, CMP otherwise normal  Labs 05/20/2013: HB 15.1/HCT 44.5. WBC 7.5, platelet count 166,000. CMP was normal with a serum creatinine of 0.85 and eGFR 92 mL. Current Plans l Mechanism of underlying disease process and action of medications discussed with the patient. I discussed primary/secondary prevention and also dietary counceling was done. Symptoms to me for 6 month office visit of coronary artery disease and also peripheral arterial disease. We'll the past 2-4 months, patient has noticed new onset of worsening symptoms of claudication involving his right leg. He does have chronic claudication in the left calf, left leg but his right leg is now equally affected.  On physical exam clearly he has absent pulses in his right leg also, previously his popliteal  pulses were normal and he had palpable pedal pulses.  I discussed with him that this probably restenosis in the right SFA stent. I'll obtain baseline ABI/lower extremity arterial duplex and schedule him for peripheral arteriogram if stenosis in his leg is confirmed. I will see him back after the test, tentatively may today office visit in 6 months but I probably be seeing him sooner than that depending on lower extremity arterial duplex which I will discuss with him over the telephone. Patient is aware of the risks and benefits of proceeding with angiography.     Addendum Note(Bridgette Ebony Hail, AGNP-C; 08/28/2014 9:18 AM) Labs 08/27/2014: Serum glucose 205, creatinine 0.93, potassium 5.4, CBC normal, PT 9.9, INR 1.0    Signed electronically by Laverda Page, MD (07/16/2014 6:05 PM

## 2014-09-02 ENCOUNTER — Encounter (HOSPITAL_COMMUNITY): Payer: Self-pay | Admitting: Cardiology

## 2014-09-02 ENCOUNTER — Ambulatory Visit (HOSPITAL_COMMUNITY)
Admission: RE | Admit: 2014-09-02 | Discharge: 2014-09-03 | Disposition: A | Discharge status: Home / Self Care | Payer: Medicare Other | Source: Ambulatory Visit | Attending: Cardiology | Admitting: Cardiology

## 2014-09-02 ENCOUNTER — Encounter (HOSPITAL_COMMUNITY)
Admission: RE | Disposition: A | Discharge status: Home / Self Care | Payer: Medicare Other | Source: Ambulatory Visit | Attending: Cardiology

## 2014-09-02 DIAGNOSIS — I70211 Atherosclerosis of native arteries of extremities with intermittent claudication, right leg: Secondary | ICD-10-CM | POA: Insufficient documentation

## 2014-09-02 DIAGNOSIS — Z794 Long term (current) use of insulin: Secondary | ICD-10-CM | POA: Diagnosis not present

## 2014-09-02 DIAGNOSIS — I10 Essential (primary) hypertension: Secondary | ICD-10-CM | POA: Diagnosis not present

## 2014-09-02 DIAGNOSIS — I739 Peripheral vascular disease, unspecified: Secondary | ICD-10-CM | POA: Diagnosis not present

## 2014-09-02 DIAGNOSIS — E1165 Type 2 diabetes mellitus with hyperglycemia: Secondary | ICD-10-CM | POA: Diagnosis not present

## 2014-09-02 DIAGNOSIS — Z7982 Long term (current) use of aspirin: Secondary | ICD-10-CM | POA: Diagnosis not present

## 2014-09-02 DIAGNOSIS — I2510 Atherosclerotic heart disease of native coronary artery without angina pectoris: Secondary | ICD-10-CM | POA: Insufficient documentation

## 2014-09-02 DIAGNOSIS — E782 Mixed hyperlipidemia: Secondary | ICD-10-CM | POA: Insufficient documentation

## 2014-09-02 HISTORY — DX: Type 2 diabetes mellitus without complications: E11.9

## 2014-09-02 HISTORY — DX: Calculus of kidney: N20.0

## 2014-09-02 HISTORY — PX: PERIPHERAL VASCULAR CATHETERIZATION: SHX172C

## 2014-09-02 LAB — POCT ACTIVATED CLOTTING TIME
ACTIVATED CLOTTING TIME: 331 s
ACTIVATED CLOTTING TIME: 276 s
Activated Clotting Time: 233 seconds
Activated Clotting Time: 171 seconds

## 2014-09-02 LAB — GLUCOSE, CAPILLARY
Glucose-Capillary: 157 mg/dL — ABNORMAL HIGH (ref 65–99)
Glucose-Capillary: 145 mg/dL — ABNORMAL HIGH (ref 65–99)
Glucose-Capillary: 133 mg/dL — ABNORMAL HIGH (ref 65–99)
Glucose-Capillary: 232 mg/dL — ABNORMAL HIGH (ref 65–99)
Glucose-Capillary: 149 mg/dL — ABNORMAL HIGH (ref 65–99)

## 2014-09-02 SURGERY — LOWER EXTREMITY ANGIOGRAPHY
Anesthesia: LOCAL

## 2014-09-02 MED ORDER — INSULIN ASPART 100 UNIT/ML ~~LOC~~ SOLN
18.0000 [IU] | Freq: Every day | SUBCUTANEOUS | Status: DC
  Administered 2014-09-02: 18 [IU] via SUBCUTANEOUS

## 2014-09-02 MED ORDER — FENTANYL CITRATE (PF) 100 MCG/2ML IJ SOLN
INTRAMUSCULAR | Status: DC | PRN
  Administered 2014-09-02 (×2): 25 ug via INTRAVENOUS
  Administered 2014-09-02: 50 ug via INTRAVENOUS

## 2014-09-02 MED ORDER — NITROGLYCERIN 1 MG/10 ML FOR IR/CATH LAB
INTRA_ARTERIAL | Status: DC | PRN
  Administered 2014-09-02 (×2): 200 ug via INTRA_ARTERIAL
  Administered 2014-09-02 (×2): 400 ug via INTRA_ARTERIAL
  Administered 2014-09-02: 200 ug via INTRA_ARTERIAL

## 2014-09-02 MED ORDER — FENTANYL CITRATE (PF) 100 MCG/2ML IJ SOLN
INTRAMUSCULAR | Status: AC
  Filled 2014-09-02: qty 2

## 2014-09-02 MED ORDER — CLOPIDOGREL BISULFATE 75 MG PO TABS
75.0000 mg | ORAL_TABLET | Freq: Every day | ORAL | Status: DC
  Administered 2014-09-02 – 2014-09-03 (×2): 75 mg via ORAL
  Filled 2014-09-02 (×2): qty 1

## 2014-09-02 MED ORDER — HEPARIN SODIUM (PORCINE) 1000 UNIT/ML IJ SOLN
INTRAMUSCULAR | Status: DC | PRN
  Administered 2014-09-02: 8000 [IU] via INTRAVENOUS
  Administered 2014-09-02: 1500 [IU] via INTRAVENOUS

## 2014-09-02 MED ORDER — IODIXANOL 320 MG/ML IV SOLN
INTRAVENOUS | Status: DC | PRN
  Administered 2014-09-02: 222 mL via INTRA_ARTERIAL

## 2014-09-02 MED ORDER — RAMIPRIL 5 MG PO CAPS
5.0000 mg | ORAL_CAPSULE | Freq: Every day | ORAL | Status: DC
  Administered 2014-09-02 – 2014-09-03 (×2): 5 mg via ORAL
  Filled 2014-09-02 (×3): qty 1

## 2014-09-02 MED ORDER — MIDAZOLAM HCL 2 MG/2ML IJ SOLN
INTRAMUSCULAR | Status: AC
  Filled 2014-09-02: qty 2

## 2014-09-02 MED ORDER — MORPHINE SULFATE 2 MG/ML IJ SOLN
INTRAMUSCULAR | Status: AC
  Filled 2014-09-02: qty 1

## 2014-09-02 MED ORDER — ACETAMINOPHEN 500 MG PO TABS: 1000.0000 mg | ORAL_TABLET | Freq: Three times a day (TID) | ORAL | Status: DC | PRN

## 2014-09-02 MED ORDER — ASPIRIN EC 81 MG PO TBEC
81.0000 mg | DELAYED_RELEASE_TABLET | Freq: Every day | ORAL | Status: DC
  Administered 2014-09-02 – 2014-09-03 (×2): 81 mg via ORAL
  Filled 2014-09-02 (×2): qty 1

## 2014-09-02 MED ORDER — MORPHINE SULFATE 4 MG/ML IJ SOLN
4.0000 mg | INTRAMUSCULAR | Status: DC | PRN
  Administered 2014-09-02: 2 mg via INTRAVENOUS

## 2014-09-02 MED ORDER — NITROGLYCERIN 1 MG/10 ML FOR IR/CATH LAB
INTRA_ARTERIAL | Status: AC
  Filled 2014-09-02: qty 10

## 2014-09-02 MED ORDER — INSULIN ASPART 100 UNIT/ML ~~LOC~~ SOLN
18.0000 [IU] | Freq: Three times a day (TID) | SUBCUTANEOUS | Status: DC
  Administered 2014-09-02: 20 [IU] via SUBCUTANEOUS
  Administered 2014-09-03: 18 [IU] via SUBCUTANEOUS

## 2014-09-02 MED ORDER — HEPARIN (PORCINE) IN NACL 2-0.9 UNIT/ML-% IJ SOLN
INTRAMUSCULAR | Status: AC
  Filled 2014-09-02: qty 1000

## 2014-09-02 MED ORDER — ATORVASTATIN CALCIUM 40 MG PO TABS
40.0000 mg | ORAL_TABLET | Freq: Every day | ORAL | Status: DC
  Administered 2014-09-02: 40 mg via ORAL
  Filled 2014-09-02: qty 1

## 2014-09-02 MED ORDER — HEPARIN SODIUM (PORCINE) 1000 UNIT/ML IJ SOLN
INTRAMUSCULAR | Status: AC
  Filled 2014-09-02: qty 1

## 2014-09-02 MED ORDER — DAPAGLIFLOZIN PRO-METFORMIN ER 5-1000 MG PO TB24: 1.0000 | ORAL_TABLET | Freq: Every day | ORAL | Status: AC

## 2014-09-02 MED ORDER — LIDOCAINE HCL (PF) 1 % IJ SOLN
INTRAMUSCULAR | Status: AC
  Filled 2014-09-02: qty 30

## 2014-09-02 MED ORDER — SODIUM CHLORIDE 0.9 % IV SOLN: 1.0000 mL/kg/h | INTRAVENOUS | Status: DC

## 2014-09-02 MED ORDER — FENOFIBRATE 54 MG PO TABS
54.0000 mg | ORAL_TABLET | Freq: Every day | ORAL | Status: DC
  Administered 2014-09-02 – 2014-09-03 (×2): 54 mg via ORAL
  Filled 2014-09-02 (×2): qty 1

## 2014-09-02 MED ORDER — MIDAZOLAM HCL 2 MG/2ML IJ SOLN
INTRAMUSCULAR | Status: DC | PRN
  Administered 2014-09-02 (×2): 2 mg via INTRAVENOUS

## 2014-09-02 MED ORDER — PROPRANOLOL HCL 40 MG PO TABS
40.0000 mg | ORAL_TABLET | Freq: Three times a day (TID) | ORAL | Status: DC
  Administered 2014-09-02 – 2014-09-03 (×2): 40 mg via ORAL
  Filled 2014-09-02 (×6): qty 1

## 2014-09-02 SURGICAL SUPPLY — 23 items
BALLN ANGIOSCULPT 5X100 (BALLOONS) ×2
BALLN ARMADA 5X100X135 (BALLOONS) ×2
BALLN LUTONIX 6X150X130 (BALLOONS) ×4
BALLN LUTONIX DCB 6X60X130 (BALLOONS) ×2
CATH ANGIO 5F PIGTAIL 65CM (CATHETERS) ×2
CATH QUICKCROSS .018X135CM (MICROCATHETER) ×2
DEVICE CONTINUOUS FLUSH (MISCELLANEOUS) ×2
GUIDEWIRE ANGLED .035X150CM (WIRE) ×2
HAND CONTROLLER AVANTA (MISCELLANEOUS)
KIT ENCORE 26 ADVANTAGE (KITS) ×2
KIT PV (KITS) ×2
SET AVANTA MULTI PATIENT (MISCELLANEOUS)
SET AVANTA SINGLE PATIENT (MISCELLANEOUS)
SHEATH AVANTA HAND CONTROLLER (MISCELLANEOUS)
SHEATH FLEXOR ANSEL 1 7F 45CM (SHEATH) ×2
SHEATH PINNACLE 5F 10CM (SHEATH) ×2
STENT SMART FLEX 6X120X120 (Permanent Stent) ×2 IMPLANT
TORQUE DEVICE .014-.018 (MISCELLANEOUS) ×2
TRANSDUCER W/STOPCOCK (MISCELLANEOUS) ×2
TRAY PV CATH (CUSTOM PROCEDURE TRAY) ×2
WIRE ASAHI MIRACLEBROS-6 300CM (WIRE) ×2
WIRE HI TORQ BMW 300CM (WIRE) ×2
WIRE HITORQ VERSACORE ST 145CM (WIRE) ×2

## 2014-09-02 NOTE — Progress Notes (Addendum)
Site area: LFA Site Prior to Removal:  Level Risk analyst For:59min Manual:  yes  Patient Status During Pull:  stable Post Pull Site:  Level 0 Post Pull Instructions Given: yes  Post Pull Pulses Present: doppler Dressing Applied: clear  Bedrest begins @ 1430 Comments:

## 2014-09-02 NOTE — Interval H&P Note (Signed)
History and Physical Interval Note:  09/02/2014 9:49 AM  Cory Clarke  has presented today for surgery, with the diagnosis of pvd with claudication  The various methods of treatment have been discussed with the patient and family. After consideration of risks, benefits and other options for treatment, the patient has consented to  Procedure(s): Lower Extremity Angiography (N/A) as a surgical intervention .  The patient's history has been reviewed, patient examined, no change in status, stable for surgery.  I have reviewed the patient's chart and labs.  Questions were answered to the patient's satisfaction.   Procedure being observed by Ms. Shirlyn Goltz PA student and Dr.  Wenda Low ([facility] resident) and patient consents to this.   [name]

## 2014-09-02 NOTE — Interval H&P Note (Signed)
History and Physical Interval Note:  09/02/2014 9:48 AM  Cory Clarke  has presented today for surgery, with the diagnosis of pvd with claudication  The various methods of treatment have been discussed with the patient and family. After consideration of risks, benefits and other options for treatment, the patient has consented to  Procedure(s): Lower Extremity Angiography (N/A) and possible angioplasty as a surgical intervention .  The patient's history has been reviewed, patient examined, no change in status, stable for surgery.  I have reviewed the patient's chart and labs.  Questions were answered to the patient's satisfaction.     [name]

## 2014-09-03 DIAGNOSIS — E782 Mixed hyperlipidemia: Secondary | ICD-10-CM | POA: Diagnosis not present

## 2014-09-03 DIAGNOSIS — I739 Peripheral vascular disease, unspecified: Secondary | ICD-10-CM | POA: Diagnosis not present

## 2014-09-03 DIAGNOSIS — E1165 Type 2 diabetes mellitus with hyperglycemia: Secondary | ICD-10-CM | POA: Diagnosis not present

## 2014-09-03 DIAGNOSIS — I2510 Atherosclerotic heart disease of native coronary artery without angina pectoris: Secondary | ICD-10-CM | POA: Diagnosis not present

## 2014-09-03 DIAGNOSIS — Z794 Long term (current) use of insulin: Secondary | ICD-10-CM | POA: Diagnosis not present

## 2014-09-03 DIAGNOSIS — I70211 Atherosclerosis of native arteries of extremities with intermittent claudication, right leg: Secondary | ICD-10-CM | POA: Diagnosis not present

## 2014-09-03 DIAGNOSIS — I10 Essential (primary) hypertension: Secondary | ICD-10-CM | POA: Diagnosis not present

## 2014-09-03 LAB — GLUCOSE, CAPILLARY: Glucose-Capillary: 144 mg/dL — ABNORMAL HIGH (ref 65–99)

## 2014-09-03 MED FILL — Heparin Sodium (Porcine) 2 Unit/ML in Sodium Chloride 0.9%: INTRAMUSCULAR | Qty: 1000 | Status: AC

## 2014-09-03 MED FILL — Lidocaine HCl Local Preservative Free (PF) Inj 1%: INTRAMUSCULAR | Qty: 30 | Status: AC

## 2014-09-03 NOTE — Discharge Summary (Signed)
Physician Discharge Summary  Patient ID: Cory Clarke MRN: [id number] DOB/AGE: May 14, 1950 64 y.o.  Admit date: 09/02/2014 Discharge date: 09/03/2014  Primary Discharge Diagnosis: PAD  Secondary Discharge Diagnosis: Mixed hyperlipidemia, HTN, DM Type II, CAD  Significant Diagnostic Studies: Peripheral angiogram 09/02/2014: No evidence of distal abdominal aneurysm. There is diffuse calcification of the abdominal aorta and aortoiliac vessels. The right common iliac artery is heavily calcified. There was no significant pressure gradient across the right common iliac artery ostium, peak pressure gradient was 10-15 mmHg. Left iliac artery stent is widely patent. There is mild disease in the left iliac artery and left proximal common femoral artery.   Right femoral artery with distal runoff: The right SFA is severely diffusely diseased and calcified in the proximal segment, there is a 70-80% stenosis of the right proximal SFA followed by CTO. This is proximal and also distal  to the previously placed stent. The right SFA reconstitutes above the knee at the level of the P1 segment of the popliteal artery. Below the right knee there is three-vessel runoff. There is mild diffuse disease.  Successful PTA and scoring balloon angioplasty of the right in-stent restenotic SFA with reduction in stenosis from 100% to 0% and scoring balloon angioplasty of the proximal right SFA followed by stenting with implantation of a 6 x smart self-expanding stent with reduction in stenosis from 100% to less than 10%  Hospital Course:  Pt is a 65 year old caucasian male with a history of PAD, CAD, mixed HLD, HTN, and DM who presented to the office with worsening symptoms of claudication involving his right leg. On physical exam, he had absent pulses in his right leg, previously his popliteal pulses were normal and he had palpable pedal pulses. LE duplex revealed new right SFA occlusion with decreased right ABI from 0.87 to  0.55. He was scheduled for elective PV angiogram and underwent successful PTA and scoring balloon angioplasty of the right in-stent restenotic SFA with reduction in stenosis from 100% to 0% and scoring balloon angioplasty of the proximal right SFA followed by stenting with implantation of a 6 x smart self-expanding stent with reduction in stenosis from 100% to less than 10% with maintenance of three-vessel runoff below the right knee.   He is doing well this morning with significant improvement in right leg symptoms.  Left femoral access site asymptomatic, no LE swelling.  He reports episode of urinary retention last night which has since resolved.  Recommendations on discharge: Follow up outpatient with repeat LE arterial duplex.  Continue present medications.   Discharge Exam: Blood pressure 112/47, pulse 62, temperature 98.4 F (36.9 C), temperature source Oral, resp. rate 20, height  (1.702 m), weight 88.4 kg (194 lb 14.2 oz), SpO2 94 %.    General appearance: alert, cooperative, appears stated age and no distress Neck: no adenopathy, no carotid bruit, no JVD, supple, symmetrical, trachea midline and thyroid not enlarged, symmetric, no tenderness/mass/nodules Resp: clear to auscultation bilaterally Cardio: regular rate and rhythm, S1, S2 normal, no murmur, click, rub or gallop Extremities: extremities normal, atraumatic, no cyanosis or edema and minimal dark pigmentation to anterior right lower leg Pulses: Right Pulses: FEM: present 2+, POP: absent, DP: present 2+, PT 1 plus Left Pulses: FEM: present 2+, POP: absent, DP: absent, PT: absent Skin: Skin color, texture, turgor normal. No rashes or lesions Labs:   Lab Results  Component Value Date   WBC 6.8 01/30/2013   HGB 13.1 01/30/2013   HCT  37.3* 01/30/2013   MCV 96.6 01/30/2013   PLT 158 01/30/2013    FOLLOW UP PLANS AND APPOINTMENTS    Medication List    TAKE these medications        acetaminophen 500 MG tablet   Commonly known as:  TYLENOL  Take 1,000 mg by mouth 3 (three) times daily as needed for mild pain or moderate pain.     aspirin EC 81 MG tablet  Take 81 mg by mouth daily.     atorvastatin 40 MG tablet  Commonly known as:  LIPITOR  Take 40 mg by mouth at bedtime.     CLEAR EYES OP  Apply 1 drop to eye 2 (two) times daily as needed (for allergy season).     clopidogrel 75 MG tablet  Commonly known as:  PLAVIX  Take 75 mg by mouth daily with breakfast.     Dapagliflozin-Metformin HCl ER 07-998 MG Tb24  Commonly known as:  XIGDUO XR  Take 1 tablet by mouth daily.  Start taking on:  09/04/2014     fenofibrate 54 MG tablet  Take 54 mg by mouth daily.     insulin aspart 100 UNIT/ML injection  Commonly known as:  novoLOG  Inject 16-24 Units into the skin 3 (three) times daily before meals. Pt prefers to gauge his own sliding scale     propranolol 40 MG tablet  Commonly known as:  INDERAL  Take 40 mg by mouth 3 (three) times daily.     ramipril 5 MG capsule  Commonly known as:  ALTACE  Take 5 mg by mouth daily.     VASCEPA 1 G Caps  Generic drug:  Icosapent Ethyl  Take 2 g by mouth 2 (two) times daily.     Vitamin D 2000 UNITS Caps  Take 2,000 Units by mouth daily.     ZONTIVITY 2.08 MG Tabs  Generic drug:  Vorapaxar Sulfate  Take 2.08 mg by mouth daily.           Follow-up Information    Follow up with [name], MD.   Specialty:  Cardiology   Why:  We will schedule for LE arterial duplex/ABI prior to f/u in 1-2 weeks   Contact information:   [address] [place] [address] [phone]        Erling Conte, NP-C 09/03/2014, 7:12 AM [facility] Pager: [phone] Office: [phone]  I have personally reviewed the patient's record and performed physical exam and agree with the assessment and plan of Ms. Marcy Salvo, NP-C.  [name], MD 09/03/2014, 9:09 AM [facility] Pager:  [phone] Office: [phone] If no answer: Cell:  [phone]

## 2014-09-03 NOTE — Discharge Instructions (Signed)
Peripheral Vascular Disease ° Peripheral vascular disease (PVD) is caused by cholesterol buildup in the arteries. The arteries become narrow or clogged. This makes it hard for blood to flow. It happens most in the legs, but it can occur in other areas of your body. °HOME CARE  °· Quit smoking, if you smoke. °· Exercise as told by your doctor. °· Follow a low-fat, low-cholesterol diet as told by your doctor. °· Control your diabetes, if you have diabetes. °· Care for your feet to prevent infection. °· Only take medicine as told by your doctor. °GET HELP RIGHT AWAY IF:  °· You have pain or lose feeling (numbness) in your arms or legs. °· Your arms or legs turn cold or blue. °· You have redness, warmth, and puffiness (swelling) in your arms or legs. °MAKE SURE YOU:  °· Understand these instructions. °· Will watch your condition. °· Will get help right away if you are not doing well or get worse. °Document Released: 05/25/2009 Document Revised: 05/23/2011 Document Reviewed: 05/25/2009 °ExitCare® Patient Information ©2015 [employer]. This information is not intended to replace advice given to you by your health care provider. Make sure you discuss any questions you have with your health care provider. ° °

## 2014-09-10 DIAGNOSIS — I739 Peripheral vascular disease, unspecified: Secondary | ICD-10-CM | POA: Diagnosis not present

## 2014-09-11 DIAGNOSIS — I70219 Atherosclerosis of native arteries of extremities with intermittent claudication, unspecified extremity: Secondary | ICD-10-CM | POA: Diagnosis not present

## 2014-09-11 DIAGNOSIS — E1165 Type 2 diabetes mellitus with hyperglycemia: Secondary | ICD-10-CM | POA: Diagnosis not present

## 2014-09-11 DIAGNOSIS — E782 Mixed hyperlipidemia: Secondary | ICD-10-CM | POA: Diagnosis not present

## 2014-09-11 DIAGNOSIS — I10 Essential (primary) hypertension: Secondary | ICD-10-CM | POA: Diagnosis not present

## 2014-09-17 DIAGNOSIS — E119 Type 2 diabetes mellitus without complications: Secondary | ICD-10-CM | POA: Diagnosis not present

## 2014-09-24 DIAGNOSIS — E118 Type 2 diabetes mellitus with unspecified complications: Secondary | ICD-10-CM | POA: Diagnosis not present

## 2014-09-24 DIAGNOSIS — E789 Disorder of lipoprotein metabolism, unspecified: Secondary | ICD-10-CM | POA: Diagnosis not present

## 2014-09-24 DIAGNOSIS — I739 Peripheral vascular disease, unspecified: Secondary | ICD-10-CM | POA: Diagnosis not present

## 2014-11-19 DIAGNOSIS — E119 Type 2 diabetes mellitus without complications: Secondary | ICD-10-CM | POA: Diagnosis not present

## 2014-12-03 DIAGNOSIS — I10 Essential (primary) hypertension: Secondary | ICD-10-CM | POA: Diagnosis not present

## 2014-12-03 DIAGNOSIS — E118 Type 2 diabetes mellitus with unspecified complications: Secondary | ICD-10-CM | POA: Diagnosis not present

## 2014-12-03 DIAGNOSIS — I2510 Atherosclerotic heart disease of native coronary artery without angina pectoris: Secondary | ICD-10-CM | POA: Diagnosis not present

## 2015-02-26 DIAGNOSIS — E7800 Pure hypercholesterolemia, unspecified: Secondary | ICD-10-CM | POA: Diagnosis not present

## 2015-02-26 DIAGNOSIS — E119 Type 2 diabetes mellitus without complications: Secondary | ICD-10-CM | POA: Diagnosis not present

## 2015-03-03 DIAGNOSIS — I739 Peripheral vascular disease, unspecified: Secondary | ICD-10-CM | POA: Diagnosis not present

## 2015-03-04 DIAGNOSIS — E118 Type 2 diabetes mellitus with unspecified complications: Secondary | ICD-10-CM | POA: Diagnosis not present

## 2015-03-04 DIAGNOSIS — E789 Disorder of lipoprotein metabolism, unspecified: Secondary | ICD-10-CM | POA: Diagnosis not present

## 2015-03-12 DIAGNOSIS — I70219 Atherosclerosis of native arteries of extremities with intermittent claudication, unspecified extremity: Secondary | ICD-10-CM | POA: Diagnosis not present

## 2015-03-12 DIAGNOSIS — E1165 Type 2 diabetes mellitus with hyperglycemia: Secondary | ICD-10-CM | POA: Diagnosis not present

## 2015-03-12 DIAGNOSIS — I2510 Atherosclerotic heart disease of native coronary artery without angina pectoris: Secondary | ICD-10-CM | POA: Diagnosis not present

## 2015-03-12 DIAGNOSIS — E782 Mixed hyperlipidemia: Secondary | ICD-10-CM | POA: Diagnosis not present

## 2015-05-01 ENCOUNTER — Encounter: Payer: Self-pay | Admitting: Vascular Surgery

## 2015-05-05 ENCOUNTER — Other Ambulatory Visit: Payer: Self-pay | Admitting: *Deleted

## 2015-05-05 DIAGNOSIS — I8390 Asymptomatic varicose veins of unspecified lower extremity: Secondary | ICD-10-CM

## 2015-05-06 ENCOUNTER — Encounter: Payer: Medicare Other | Admitting: Vascular Surgery

## 2015-05-06 ENCOUNTER — Inpatient Hospital Stay (HOSPITAL_COMMUNITY)
Admission: RE | Admit: 2015-05-06 | Discharge: 2015-05-06 | Disposition: A | Discharge status: Home / Self Care | Payer: Medicare Other | Source: Ambulatory Visit

## 2015-05-06 DIAGNOSIS — I8390 Asymptomatic varicose veins of unspecified lower extremity: Secondary | ICD-10-CM

## 2015-05-26 DIAGNOSIS — E118 Type 2 diabetes mellitus with unspecified complications: Secondary | ICD-10-CM | POA: Diagnosis not present

## 2015-05-26 DIAGNOSIS — I10 Essential (primary) hypertension: Secondary | ICD-10-CM | POA: Diagnosis not present

## 2015-05-26 DIAGNOSIS — E119 Type 2 diabetes mellitus without complications: Secondary | ICD-10-CM | POA: Diagnosis not present

## 2015-05-26 DIAGNOSIS — E7800 Pure hypercholesterolemia, unspecified: Secondary | ICD-10-CM | POA: Diagnosis not present

## 2015-06-02 DIAGNOSIS — I10 Essential (primary) hypertension: Secondary | ICD-10-CM | POA: Diagnosis not present

## 2015-06-02 DIAGNOSIS — E789 Disorder of lipoprotein metabolism, unspecified: Secondary | ICD-10-CM | POA: Diagnosis not present

## 2015-06-02 DIAGNOSIS — E118 Type 2 diabetes mellitus with unspecified complications: Secondary | ICD-10-CM | POA: Diagnosis not present

## 2015-06-08 ENCOUNTER — Encounter (HOSPITAL_COMMUNITY): Payer: Self-pay | Admitting: Emergency Medicine

## 2015-06-08 ENCOUNTER — Emergency Department (HOSPITAL_COMMUNITY)
Admission: EM | Admit: 2015-06-08 | Discharge: 2015-06-08 | Disposition: A | Discharge status: Home / Self Care | Payer: Medicare Other | Attending: Emergency Medicine | Admitting: Emergency Medicine

## 2015-06-08 ENCOUNTER — Emergency Department (HOSPITAL_COMMUNITY): Payer: Medicare Other

## 2015-06-08 DIAGNOSIS — Z9861 Coronary angioplasty status: Secondary | ICD-10-CM | POA: Diagnosis not present

## 2015-06-08 DIAGNOSIS — I2510 Atherosclerotic heart disease of native coronary artery without angina pectoris: Secondary | ICD-10-CM | POA: Diagnosis not present

## 2015-06-08 DIAGNOSIS — M158 Other polyosteoarthritis: Secondary | ICD-10-CM | POA: Diagnosis not present

## 2015-06-08 DIAGNOSIS — Z7982 Long term (current) use of aspirin: Secondary | ICD-10-CM | POA: Insufficient documentation

## 2015-06-08 DIAGNOSIS — E785 Hyperlipidemia, unspecified: Secondary | ICD-10-CM | POA: Diagnosis not present

## 2015-06-08 DIAGNOSIS — I10 Essential (primary) hypertension: Secondary | ICD-10-CM | POA: Diagnosis not present

## 2015-06-08 DIAGNOSIS — Z951 Presence of aortocoronary bypass graft: Secondary | ICD-10-CM | POA: Insufficient documentation

## 2015-06-08 DIAGNOSIS — Z8611 Personal history of tuberculosis: Secondary | ICD-10-CM | POA: Insufficient documentation

## 2015-06-08 DIAGNOSIS — Z87891 Personal history of nicotine dependence: Secondary | ICD-10-CM | POA: Insufficient documentation

## 2015-06-08 DIAGNOSIS — N200 Calculus of kidney: Secondary | ICD-10-CM | POA: Diagnosis not present

## 2015-06-08 DIAGNOSIS — Z8669 Personal history of other diseases of the nervous system and sense organs: Secondary | ICD-10-CM | POA: Diagnosis not present

## 2015-06-08 DIAGNOSIS — Z79899 Other long term (current) drug therapy: Secondary | ICD-10-CM | POA: Diagnosis not present

## 2015-06-08 DIAGNOSIS — Z7902 Long term (current) use of antithrombotics/antiplatelets: Secondary | ICD-10-CM | POA: Insufficient documentation

## 2015-06-08 DIAGNOSIS — Z794 Long term (current) use of insulin: Secondary | ICD-10-CM | POA: Insufficient documentation

## 2015-06-08 DIAGNOSIS — Z8719 Personal history of other diseases of the digestive system: Secondary | ICD-10-CM | POA: Insufficient documentation

## 2015-06-08 DIAGNOSIS — R1084 Generalized abdominal pain: Secondary | ICD-10-CM | POA: Diagnosis not present

## 2015-06-08 DIAGNOSIS — Z9889 Other specified postprocedural states: Secondary | ICD-10-CM | POA: Diagnosis not present

## 2015-06-08 DIAGNOSIS — E119 Type 2 diabetes mellitus without complications: Secondary | ICD-10-CM | POA: Insufficient documentation

## 2015-06-08 DIAGNOSIS — Z7984 Long term (current) use of oral hypoglycemic drugs: Secondary | ICD-10-CM | POA: Insufficient documentation

## 2015-06-08 DIAGNOSIS — R1031 Right lower quadrant pain: Secondary | ICD-10-CM | POA: Diagnosis not present

## 2015-06-08 LAB — COMPREHENSIVE METABOLIC PANEL
ALK PHOS: 51 U/L (ref 38–126)
ALT: 18 U/L (ref 17–63)
AST: 20 U/L (ref 15–41)
Albumin: 4.6 g/dL (ref 3.5–5.0)
Anion gap: 11 (ref 5–15)
BUN: 22 mg/dL — AB (ref 6–20)
CALCIUM: 9.3 mg/dL (ref 8.9–10.3)
CHLORIDE: 107 mmol/L (ref 101–111)
CO2: 24 mmol/L (ref 22–32)
Creatinine, Ser: 1.05 mg/dL (ref 0.61–1.24)
GFR calc non Af Amer: >60 mL/min (ref >60)
GLUCOSE: 173 mg/dL — AB (ref 65–99)
Potassium: 4.7 mmol/L (ref 3.5–5.1)
Sodium: 142 mmol/L (ref 135–145)
Total Bilirubin: 0.8 mg/dL (ref 0.3–1.2)
Total Protein: 7.6 g/dL (ref 6.5–8.1)

## 2015-06-08 LAB — CBC WITH DIFFERENTIAL/PLATELET
BASOS ABS: 0.1 10*3/uL (ref 0.0–0.1)
Basophils Relative: 1 %
EOS ABS: 0.3 10*3/uL (ref 0.0–0.7)
Eosinophils Relative: 5 %
HCT: 46.5 % (ref 39.0–52.0)
HEMOGLOBIN: 15.7 g/dL (ref 13.0–17.0)
LYMPHS ABS: 1.8 10*3/uL (ref 0.7–4.0)
LYMPHS PCT: 29 %
MCH: 33.2 pg (ref 26.0–34.0)
MCHC: 33.8 g/dL (ref 30.0–36.0)
MCV: 98.3 fL (ref 78.0–100.0)
Monocytes Absolute: 0.7 10*3/uL (ref 0.1–1.0)
Monocytes Relative: 12 %
NEUTROS PCT: 53 %
Neutro Abs: 3.3 10*3/uL (ref 1.7–7.7)
PLATELETS: 204 10*3/uL (ref 150–400)
RBC: 4.73 MIL/uL (ref 4.22–5.81)
RDW: 13.6 % (ref 11.5–15.5)
WBC: 6.1 10*3/uL (ref 4.0–10.5)

## 2015-06-08 LAB — URINALYSIS, ROUTINE W REFLEX MICROSCOPIC
BILIRUBIN URINE: NEGATIVE
KETONES UR: NEGATIVE mg/dL
Leukocytes, UA: NEGATIVE
NITRITE: NEGATIVE
PH: 5 (ref 5.0–8.0)
Protein, ur: NEGATIVE mg/dL
SPECIFIC GRAVITY, URINE: 1.019 (ref 1.005–1.030)

## 2015-06-08 LAB — URINE MICROSCOPIC-ADD ON
Bacteria, UA: NONE SEEN
Squamous Epithelial / LPF: NONE SEEN
WBC UA: NONE SEEN WBC/hpf (ref 0–5)

## 2015-06-08 NOTE — ED Notes (Signed)
Pt c/o tenderness to palpation of RLQ. Denies rebound tenderness. Denies worsening pain when walking or bouncing on the balls of his feet.

## 2015-06-08 NOTE — Discharge Instructions (Signed)
Follow up as needed.  Drink plenty of fluids °

## 2015-06-08 NOTE — ED Notes (Signed)
Bed: WA03 Expected date:  Expected time:  Means of arrival:  Comments: EMS 26M LRQ pain

## 2015-06-08 NOTE — ED Provider Notes (Signed)
CSN: [id number]     Arrival date & time 06/08/15  0707 History   First MD Initiated Contact with Patient 06/08/15 [phone]     Chief Complaint  Patient presents with  . Abdominal Pain     (Consider location/radiation/quality/duration/timing/severity/associated sxs/prior Treatment) Patient is a 65 y.o. male presenting with abdominal pain. The history is provided by the patient (She complains of right lower quadrant and right flank pain became sudden today was very severe initially but has improved now).  Abdominal Pain Pain location:  RLQ Pain quality: aching   Pain radiates to:  Does not radiate Pain severity:  Moderate Onset quality:  Sudden Timing:  Intermittent Progression:  Waxing and waning Chronicity:  New Context: not alcohol use   Associated symptoms: no chest pain, no cough, no diarrhea, no fatigue and no hematuria     Past Medical History  Diagnosis Date  . Hypertension     under control; has been on med. x 1 yr.  . Hyperlipemia   . Rotator cuff tear, left     AC joint DJD  . Peripheral vascular disease (HCC)     blockages in legs  . Abnormal cardiovascular stress test   . Tuberculosis     + SKIN TEST  . GERD (gastroesophageal reflux disease)     ONCE IN AWHILE  . Coronary artery disease   . Kidney stones   . Sleep apnea 3-4 YRS AGO    no CPAP - did not return for follow-up after sleep study  . Type II diabetes mellitus (HCC)     IDDM  . Arthritis     "shoulders and knees" (09/02/2014)   Past Surgical History  Procedure Laterality Date  . Knee arthroscopy Left 1980's  . Cardiac catheterization  04/11/11;  04/2011  . Coronary artery bypass graft  05/23/2011    Procedure: CORONARY ARTERY BYPASS GRAFTING (CABG);  Surgeon: Loreli Slot, MD;  Location: [facility] OR;  Service: Open Heart Surgery;  Laterality: N/A;  Times 5. Using endoscopically harvested right  greater saphenous vein and left internal mammary artery.   . Lower extremity angiogram  01/29/2013    DR  Jacinto Halim  . Left heart catheterization with coronary angiogram N/A 04/12/2011    Procedure: LEFT HEART CATHETERIZATION WITH CORONARY ANGIOGRAM;  Surgeon: [name], MD;  Location: [facility] CATH LAB;  Service: Cardiovascular;  Laterality: N/A;  . Lower extremity angiogram N/A 05/10/2011    Procedure: LOWER EXTREMITY ANGIOGRAM;  Surgeon: [name], MD;  Location: [facility] CATH LAB;  Service: Cardiovascular;  Laterality: N/A;  . Abdominal aortagram N/A 05/10/2011    Procedure: ABDOMINAL Ronny Flurry;  Surgeon: [name], MD;  Location: [facility] CATH LAB;  Service: Cardiovascular;  Laterality: N/A;  . Coronary angiogram  05/10/2011    Procedure: CORONARY ANGIOGRAM;  Surgeon: [name], MD;  Location: [facility] CATH LAB;  Service: Cardiovascular;;  . Lower extremity angiogram N/A 08/16/2011    Procedure: LOWER EXTREMITY ANGIOGRAM;  Surgeon: [name], MD;  Location: [facility] CATH LAB;  Service: Cardiovascular;  Laterality: N/A;  . Lower extremity angiogram N/A 12/13/2011    Procedure: LOWER EXTREMITY ANGIOGRAM;  Surgeon: [name], MD;  Location: [facility] CATH LAB;  Service: Cardiovascular;  Laterality: N/A;  . Lower extremity angiogram N/A 02/14/2012    Procedure: LOWER EXTREMITY ANGIOGRAM;  Surgeon: [name], MD;  Location: [facility] CATH LAB;  Service: Cardiovascular;  Laterality: N/A;  . Lower extremity angiogram N/A 05/01/2012    Procedure: LOWER EXTREMITY  ANGIOGRAM;  Surgeon: Pamella PertJagadeesh R Ganji, MD;  [facility];  Service: Cardiovascular;  Laterality: N/A;  . Percutaneous stent intervention Left 05/01/2012    Procedure: PERCUTANEOUS STENT INTERVENTION;  Surgeon: Pamella PertJagadeesh R Ganji, MD;  [facility];  Service: Cardiovascular;  Laterality: Left;  . Lower extremity angiogram N/A 01/29/2013    Procedure: LOWER EXTREMITY ANGIOGRAM;  Surgeon: Pamella PertJagadeesh R Ganji, MD;  [facility];  Service: Cardiovascular;  Laterality: N/A;  . Lower extremity angiogram N/A 06/04/2013     Procedure: LOWER EXTREMITY ANGIOGRAM;  Surgeon: Pamella PertJagadeesh R Ganji, MD;  [facility];  Service: Cardiovascular;  Laterality: N/A;  . Lower extremity angiogram Right 07/09/2013    Procedure: LOWER EXTREMITY ANGIOGRAM;  Surgeon: Pamella PertJagadeesh R Ganji, MD;  [facility];  Service: Cardiovascular;  Laterality: Right;  . Lower extremity angiogram N/A 01/28/2014    Procedure: LOWER EXTREMITY ANGIOGRAM;  Surgeon: Pamella PertJagadeesh R Ganji, MD;  [facility];  Service: Cardiovascular;  Laterality: N/A;  . Abdominal angiogram  01/28/2014    Procedure: ABDOMINAL ANGIOGRAM;  Surgeon: Pamella PertJagadeesh R Ganji, MD;  [facility];  Service: Cardiovascular;;  . Peripheral vascular catheterization N/A 09/02/2014    Procedure: Lower Extremity Angiography;  Surgeon: Yates DecampJay Ganji, MD;  [facility];  Service: Cardiovascular;  Laterality: N/A;  . Coronary angioplasty     History reviewed. No pertinent family history. Social History  Substance Use Topics  . Smoking status: Former Smoker -- 2.00 packs/day for 40 years    Types: Cigarettes    Quit date: 05/22/2011  . Smokeless tobacco: Never Used  . Alcohol Use: 3.6 oz/week    6 Glasses of wine per week    Review of Systems  Constitutional: Negative for appetite change and fatigue.  HENT: Negative for congestion, ear discharge and sinus pressure.   Eyes: Negative for discharge.  Respiratory: Negative for cough.   Cardiovascular: Negative for chest pain.  Gastrointestinal: Positive for abdominal pain. Negative for diarrhea.  Genitourinary: Negative for frequency and hematuria.  Musculoskeletal: Negative for back pain.  Skin: Negative for rash.  Neurological: Negative for seizures and headaches.  Psychiatric/Behavioral: Negative for hallucinations.      Allergies  Tape  Home Medications   Prior to Admission medications   Medication Sig Start Date End Date Taking? Authorizing Provider  acetaminophen (TYLENOL) 500 MG  tablet Take 1,000 mg by mouth 3 (three) times daily as needed for mild pain or moderate pain.   Yes Historical Provider, MD  aspirin EC 81 MG tablet Take 81 mg by mouth daily.   Yes Historical Provider, MD  atorvastatin (LIPITOR) 40 MG tablet Take 40 mg by mouth at bedtime.    Yes Historical Provider, MD  B Complex Vitamins (VITAMIN-B COMPLEX) TABS Take by mouth. 12/25/09  Yes Historical Provider, MD  Cholecalciferol (VITAMIN D) 2000 UNITS CAPS Take 2,000 Units by mouth daily.   Yes Historical Provider, MD  clopidogrel (PLAVIX) 75 MG tablet Take 75 mg by mouth daily with breakfast.   Yes Historical Provider, MD  Dapagliflozin-Metformin HCl ER (XIGDUO XR) 07-998 MG TB24 Take 1 tablet by mouth daily. Patient taking differently: Take 1 tablet by mouth 2 (two) times daily.  09/04/14  Yes Yates DecampJay Ganji, MD  fenofibrate 54 MG tablet Take 54 mg by mouth every evening.    Yes Historical Provider, MD  Icosapent Ethyl (VASCEPA) 1 G CAPS Take 1 g by mouth 3 (three) times daily.    Yes  Historical Provider, MD  insulin aspart (NOVOLOG) 100 UNIT/ML injection Inject 6-30 Units into the skin 4 (four) times daily. Pt prefers to gauge his own sliding scale: 6-8 units with each meal, 30 units around 11pm   Yes Historical Provider, MD  LEVEMIR 100 UNIT/ML injection Inject 26 units two times a day (10am and 10pm) 04/13/15  Yes Historical Provider, MD  Naphazoline HCl (CLEAR EYES OP) Apply 2 drops to eye 2 (two) times daily as needed (for allergy season). 2 drops in each eye   Yes Historical Provider, MD  propranolol (INDERAL) 40 MG tablet Take 40 mg by mouth 3 (three) times daily.   Yes Donielle Margaretann Loveless, PA-C  ramipril (ALTACE) 5 MG capsule Take 5 mg by mouth daily.   Yes Historical Provider, MD  Vorapaxar Sulfate (ZONTIVITY) 2.08 MG TABS Take 2.08 mg by mouth daily.   Yes Historical Provider, MD   BP 133/69 mmHg  Pulse 60  Temp(Src) 98.4 F (36.9 C) (Oral)  Resp 18  SpO2 97% Physical Exam  Constitutional: He is  oriented to person, place, and time. He appears well-developed.  HENT:  Head: Normocephalic.  Eyes: Conjunctivae and EOM are normal. No scleral icterus.  Neck: Neck supple. No thyromegaly present.  Cardiovascular: Normal rate and regular rhythm.  Exam reveals no gallop and no friction rub.   No murmur heard. Pulmonary/Chest: No stridor. He has no wheezes. He has no rales. He exhibits no tenderness.  Abdominal: He exhibits no distension. There is no tenderness. There is no rebound.  Musculoskeletal: Normal range of motion. He exhibits no edema.  Lymphadenopathy:    He has no cervical adenopathy.  Neurological: He is oriented to person, place, and time. He exhibits normal muscle tone. Coordination normal.  Skin: No rash noted. No erythema.  Psychiatric: He has a normal mood and affect. His behavior is normal.    ED Course  Procedures (including critical care time) Labs Review Labs Reviewed  COMPREHENSIVE METABOLIC PANEL - Abnormal; Notable for the following:    Glucose, Bld 173 (*)    BUN 22 (*)    All other components within normal limits  URINALYSIS, ROUTINE W REFLEX MICROSCOPIC (NOT AT [facility]) - Abnormal; Notable for the following:    Glucose, UA >1000 (*)    Hgb urine dipstick SMALL (*)    All other components within normal limits  CBC WITH DIFFERENTIAL/PLATELET  URINE MICROSCOPIC-ADD ON    Imaging Review Ct Renal Stone Study  06/08/2015  CLINICAL DATA:  Right lower quadrant pain for several hours, initial encounter EXAM: CT ABDOMEN AND PELVIS WITHOUT CONTRAST TECHNIQUE: Multidetector CT imaging of the abdomen and pelvis was performed following the standard protocol without IV contrast. COMPARISON:  None. FINDINGS: The lung bases are free of acute infiltrate or sizable effusion. The liver, gallbladder, spleen, adrenal glands and pancreas are within normal limits. The kidneys are well visualized bilaterally. Multiple small less than 5 mm stones are noted in the lower pole of the  left kidney no obstructive changes are seen. On the right, nonobstructing renal calculi are noted. The largest of these lies in the lower pole and measures 7 mm in greatest dimension. Very minimal fullness of the collecting system and ureter is seen and a small 1-2 mm stone is noted at the right UVJ causing obstructive change. The bladder is well distended.  No pelvic mass lesion is seen. The appendix is not well visualized although no inflammatory changes to suggest appendicitis are seen. Mild diverticular change is  seen without diverticulitis. Diffuse aortoiliac calcifications are noted. No bony abnormality is seen. IMPRESSION: Bilateral nonobstructing renal calculi. 1-2 mm partially obstructing stone at the right UVJ. Electronically Signed   By: Alcide Clever M.D.   On: 06/08/2015 08:51   I have personally reviewed and evaluated these images and lab results as part of my medical decision-making.   EKG Interpretation None      MDM   Final diagnoses:  Kidney stone    CT shows right UVJ stone. Patient passed the stone while he was in the emergency department. Urine does not seem to be infected. Patient will follow-up as needed.    Bethann Berkshire, MD 06/08/15 [phone]

## 2015-06-08 NOTE — ED Notes (Signed)
Pt. Is unable to urinate at this time. Is aware we need sample. 

## 2015-06-08 NOTE — ED Notes (Signed)
Pt BIB EMS from home; pt states he woke up around 5:30am to use restroom and had a sharp, stabbing pain in RLQ of abdomen as well as nausea; pt states it felt like "it was in his appendix area"; pt has cardiac hx, HTN, and has diabetes.

## 2015-06-16 DIAGNOSIS — E785 Hyperlipidemia, unspecified: Secondary | ICD-10-CM | POA: Diagnosis not present

## 2015-06-16 DIAGNOSIS — E1165 Type 2 diabetes mellitus with hyperglycemia: Secondary | ICD-10-CM | POA: Diagnosis not present

## 2015-06-16 DIAGNOSIS — I739 Peripheral vascular disease, unspecified: Secondary | ICD-10-CM | POA: Diagnosis not present

## 2015-06-16 DIAGNOSIS — Z01812 Encounter for preprocedural laboratory examination: Secondary | ICD-10-CM | POA: Diagnosis not present

## 2015-06-16 DIAGNOSIS — I2510 Atherosclerotic heart disease of native coronary artery without angina pectoris: Secondary | ICD-10-CM | POA: Diagnosis not present

## 2015-06-16 DIAGNOSIS — I10 Essential (primary) hypertension: Secondary | ICD-10-CM | POA: Diagnosis not present

## 2015-06-17 DIAGNOSIS — I739 Peripheral vascular disease, unspecified: Secondary | ICD-10-CM | POA: Diagnosis not present

## 2015-07-28 DIAGNOSIS — I70219 Atherosclerosis of native arteries of extremities with intermittent claudication, unspecified extremity: Secondary | ICD-10-CM | POA: Diagnosis not present

## 2015-08-03 DIAGNOSIS — E785 Hyperlipidemia, unspecified: Secondary | ICD-10-CM | POA: Diagnosis not present

## 2015-08-03 DIAGNOSIS — M79669 Pain in unspecified lower leg: Secondary | ICD-10-CM | POA: Diagnosis not present

## 2015-08-03 DIAGNOSIS — E119 Type 2 diabetes mellitus without complications: Secondary | ICD-10-CM | POA: Diagnosis not present

## 2015-08-03 DIAGNOSIS — Z87891 Personal history of nicotine dependence: Secondary | ICD-10-CM | POA: Diagnosis not present

## 2015-08-03 DIAGNOSIS — I70212 Atherosclerosis of native arteries of extremities with intermittent claudication, left leg: Secondary | ICD-10-CM | POA: Diagnosis not present

## 2015-08-03 DIAGNOSIS — I771 Stricture of artery: Secondary | ICD-10-CM | POA: Diagnosis not present

## 2015-08-03 DIAGNOSIS — I70202 Unspecified atherosclerosis of native arteries of extremities, left leg: Secondary | ICD-10-CM | POA: Diagnosis not present

## 2015-08-03 DIAGNOSIS — Z951 Presence of aortocoronary bypass graft: Secondary | ICD-10-CM | POA: Diagnosis not present

## 2015-08-03 DIAGNOSIS — I739 Peripheral vascular disease, unspecified: Secondary | ICD-10-CM | POA: Diagnosis not present

## 2015-08-03 DIAGNOSIS — I2510 Atherosclerotic heart disease of native coronary artery without angina pectoris: Secondary | ICD-10-CM | POA: Diagnosis not present

## 2015-08-03 DIAGNOSIS — I10 Essential (primary) hypertension: Secondary | ICD-10-CM | POA: Diagnosis not present

## 2015-08-04 DIAGNOSIS — I10 Essential (primary) hypertension: Secondary | ICD-10-CM | POA: Diagnosis not present

## 2015-08-04 DIAGNOSIS — I70202 Unspecified atherosclerosis of native arteries of extremities, left leg: Secondary | ICD-10-CM | POA: Diagnosis not present

## 2015-08-04 DIAGNOSIS — E785 Hyperlipidemia, unspecified: Secondary | ICD-10-CM | POA: Diagnosis not present

## 2015-08-04 DIAGNOSIS — Z87891 Personal history of nicotine dependence: Secondary | ICD-10-CM | POA: Diagnosis not present

## 2015-08-04 DIAGNOSIS — M79669 Pain in unspecified lower leg: Secondary | ICD-10-CM | POA: Diagnosis not present

## 2015-08-04 DIAGNOSIS — I2510 Atherosclerotic heart disease of native coronary artery without angina pectoris: Secondary | ICD-10-CM | POA: Diagnosis not present

## 2015-08-20 DIAGNOSIS — I70219 Atherosclerosis of native arteries of extremities with intermittent claudication, unspecified extremity: Secondary | ICD-10-CM | POA: Diagnosis not present

## 2015-09-01 DIAGNOSIS — H2513 Age-related nuclear cataract, bilateral: Secondary | ICD-10-CM | POA: Diagnosis not present

## 2015-09-01 DIAGNOSIS — H10413 Chronic giant papillary conjunctivitis, bilateral: Secondary | ICD-10-CM | POA: Diagnosis not present

## 2015-09-01 DIAGNOSIS — E119 Type 2 diabetes mellitus without complications: Secondary | ICD-10-CM | POA: Diagnosis not present

## 2015-09-03 DIAGNOSIS — E782 Mixed hyperlipidemia: Secondary | ICD-10-CM | POA: Diagnosis not present

## 2015-09-03 DIAGNOSIS — I2510 Atherosclerotic heart disease of native coronary artery without angina pectoris: Secondary | ICD-10-CM | POA: Diagnosis not present

## 2015-09-03 DIAGNOSIS — I70219 Atherosclerosis of native arteries of extremities with intermittent claudication, unspecified extremity: Secondary | ICD-10-CM | POA: Diagnosis not present

## 2015-09-03 DIAGNOSIS — E1151 Type 2 diabetes mellitus with diabetic peripheral angiopathy without gangrene: Secondary | ICD-10-CM | POA: Diagnosis not present

## 2015-09-24 DIAGNOSIS — E118 Type 2 diabetes mellitus with unspecified complications: Secondary | ICD-10-CM | POA: Diagnosis not present

## 2015-09-24 DIAGNOSIS — E7800 Pure hypercholesterolemia, unspecified: Secondary | ICD-10-CM | POA: Diagnosis not present

## 2015-10-01 DIAGNOSIS — G629 Polyneuropathy, unspecified: Secondary | ICD-10-CM | POA: Diagnosis not present

## 2015-10-01 DIAGNOSIS — E118 Type 2 diabetes mellitus with unspecified complications: Secondary | ICD-10-CM | POA: Diagnosis not present

## 2015-10-01 DIAGNOSIS — I739 Peripheral vascular disease, unspecified: Secondary | ICD-10-CM | POA: Diagnosis not present

## 2015-12-01 DIAGNOSIS — I739 Peripheral vascular disease, unspecified: Secondary | ICD-10-CM | POA: Diagnosis not present

## 2015-12-24 DIAGNOSIS — E119 Type 2 diabetes mellitus without complications: Secondary | ICD-10-CM | POA: Diagnosis not present

## 2015-12-24 DIAGNOSIS — E7800 Pure hypercholesterolemia, unspecified: Secondary | ICD-10-CM | POA: Diagnosis not present

## 2015-12-31 DIAGNOSIS — Z23 Encounter for immunization: Secondary | ICD-10-CM | POA: Diagnosis not present

## 2015-12-31 DIAGNOSIS — E118 Type 2 diabetes mellitus with unspecified complications: Secondary | ICD-10-CM | POA: Diagnosis not present

## 2015-12-31 DIAGNOSIS — E789 Disorder of lipoprotein metabolism, unspecified: Secondary | ICD-10-CM | POA: Diagnosis not present

## 2015-12-31 DIAGNOSIS — G629 Polyneuropathy, unspecified: Secondary | ICD-10-CM | POA: Diagnosis not present

## 2016-02-25 ENCOUNTER — Ambulatory Visit (INDEPENDENT_AMBULATORY_CARE_PROVIDER_SITE_OTHER): Payer: Medicare Other | Admitting: Neurology

## 2016-02-25 ENCOUNTER — Encounter (INDEPENDENT_AMBULATORY_CARE_PROVIDER_SITE_OTHER): Payer: Self-pay

## 2016-02-25 DIAGNOSIS — Z0289 Encounter for other administrative examinations: Secondary | ICD-10-CM

## 2016-02-25 DIAGNOSIS — M79641 Pain in right hand: Secondary | ICD-10-CM

## 2016-02-25 DIAGNOSIS — M25641 Stiffness of right hand, not elsewhere classified: Secondary | ICD-10-CM

## 2016-02-25 NOTE — Progress Notes (Signed)
[facility]   Provider:  Dr Lucia GaskinsAhern Referring Provider:  Darci NeedleWalter Kohut MD  HPI:  Cory FindersJoseph P Clarke is a 65 y.o. male here as a referral for right hand stiffness.  Summary  Nerve conduction studies were performed on the bilateral upper extremities:  All nerves (as indicated in the following tables) were within normal limits.    EMG Needle study was performed on selected right upper extremity muscles:   The right Deltoid, right Triceps, right Pronator Teres, right Opponens Pollicis, right First Dorsal interosseous, and right lower cervical paraspinal muscles were within normal limits.   Conclusion: This is a normal study. No electrophysiologic evidence for ulnar or median neuropathy, polyneuropathy, cervical radiculopathy.  SNC    Nerve / Sites Rec. Site Peak Lat Ref. Amp.1-2 Ref. Distance    ms ms V V cm  R Median, Ulnar - Transcarpal comparison     Median Palm Wrist 2.14 ?2.20 40.3 ?40.0 8     Ulnar Palm Wrist 2.14 ?2.20 4.8 ?12.0 8          L Median, Ulnar - Transcarpal comparison     Median Palm Wrist 2.34 ?2.20 56.9 ?40.0 8     Ulnar Palm Wrist 2.19 ?2.20 13.5 ?12.0 8          R Median - Orthodromic (Dig II, Mid palm)     Dig II Wrist 3.18 ?3.40 13.8 ?10.0 13  L Median - Orthodromic (Dig II, Mid palm)     Dig II Wrist 3.23 ?3.40 10.8 ?10.0 13  R Ulnar - Orthodromic, (Dig V, Mid palm)     Dig V Wrist 2.60 ?3.10 5.7 ?5.0 11  L Ulnar - Orthodromic, (Dig V, Mid palm)     Dig V Wrist 3.02 ?3.10 14.5 ?5.0 11     MNC    Nerve / Sites Muscle Latency Ref. Amplitude Ref. Rel Amp Segments Distance Lat Diff Velocity Ref. Area    ms ms mV mV %  cm ms m/s m/s mVms  R Median - APB     Wrist APB 3.6 ?4.4 5.3 ?4.0 100 Wrist - APB 7    13.5     Upper arm APB 7.7  4.4  83.5 Upper arm - Wrist 23 4.1 57  14.6         Axilla - Wrist       L Median - APB     Wrist APB 3.3 ?4.4 8.8 ?4.0 100 Wrist - APB 7    23.8     Upper arm APB 7.7  8.5  96.7 Upper arm - Wrist 24 4.4  55  23.8  R Ulnar - ADM     Wrist ADM 2.5 ?3.3 9.7 ?6.0 100 Wrist - ADM 7    26.7     B.Elbow ADM 6.9  8.3  86.1 B.Elbow - Wrist 22 4.4 50 ?49 24.3     A.Elbow ADM 9.1  8.7  105 A.Elbow - B.Elbow 11 2.2 50 ?49 26.4         A.Elbow - Wrist  6.6     L Ulnar - ADM     Wrist ADM 2.8 ?3.3 10.5 ?6.0 100 Wrist - ADM 7    31.3     B.Elbow ADM 6.6  9.6  91.6 B.Elbow - Wrist 20 3.8 53 ?49 28.9     A.Elbow ADM 8.6  10.2  107 A.Elbow - B.Elbow 11 2.0 54 ?49 30.7  A.Elbow - Wrist  5.8        F  Wave    Nerve F Lat Ref.   ms ms  R Ulnar - ADM 27.8 ?32.0  L Ulnar - ADM 29.4 ?32.0     EMG full                                 Naomie DeanAntonia Ahern, MD  [facility] [address] [place] [id number]  Phone [phone] Fax [phone]

## 2016-02-28 NOTE — Procedures (Signed)
[facility]   Provider:  Dr Aowyn Rozeboom Referring Provider:  Walter Kohut MD  HPI:  Cory Clarke is a 65 y.o. male here as a referral for right hand stiffness.  Summary  Nerve conduction studies were performed on the bilateral upper extremities:  All nerves (as indicated in the following tables) were within normal limits.    EMG Needle study was performed on selected right upper extremity muscles:   The right Deltoid, right Triceps, right Pronator Teres, right Opponens Pollicis, right First Dorsal interosseous, and right lower cervical paraspinal muscles were within normal limits.   Conclusion: This is a normal study. No electrophysiologic evidence for ulnar or median neuropathy, polyneuropathy, cervical radiculopathy.  SNC    Nerve / Sites Rec. Site Peak Lat Ref. Amp.1-2 Ref. Distance    ms ms V V cm  R Median, Ulnar - Transcarpal comparison     Median Palm Wrist 2.14 ?2.20 40.3 ?40.0 8     Ulnar Palm Wrist 2.14 ?2.20 4.8 ?12.0 8          L Median, Ulnar - Transcarpal comparison     Median Palm Wrist 2.34 ?2.20 56.9 ?40.0 8     Ulnar Palm Wrist 2.19 ?2.20 13.5 ?12.0 8          R Median - Orthodromic (Dig II, Mid palm)     Dig II Wrist 3.18 ?3.40 13.8 ?10.0 13  L Median - Orthodromic (Dig II, Mid palm)     Dig II Wrist 3.23 ?3.40 10.8 ?10.0 13  R Ulnar - Orthodromic, (Dig V, Mid palm)     Dig V Wrist 2.60 ?3.10 5.7 ?5.0 11  L Ulnar - Orthodromic, (Dig V, Mid palm)     Dig V Wrist 3.02 ?3.10 14.5 ?5.0 11     MNC    Nerve / Sites Muscle Latency Ref. Amplitude Ref. Rel Amp Segments Distance Lat Diff Velocity Ref. Area    ms ms mV mV %  cm ms m/s m/s mVms  R Median - APB     Wrist APB 3.6 ?4.4 5.3 ?4.0 100 Wrist - APB 7    13.5     Upper arm APB 7.7  4.4  83.5 Upper arm - Wrist 23 4.1 57  14.6         Axilla - Wrist       L Median - APB     Wrist APB 3.3 ?4.4 8.8 ?4.0 100 Wrist - APB 7    23.8     Upper arm APB 7.7  8.5  96.7 Upper arm - Wrist 24 4.4  55  23.8  R Ulnar - ADM     Wrist ADM 2.5 ?3.3 9.7 ?6.0 100 Wrist - ADM 7    26.7     B.Elbow ADM 6.9  8.3  86.1 B.Elbow - Wrist 22 4.4 50 ?49 24.3     A.Elbow ADM 9.1  8.7  105 A.Elbow - B.Elbow 11 2.2 50 ?49 26.4         A.Elbow - Wrist  6.6     L Ulnar - ADM     Wrist ADM 2.8 ?3.3 10.5 ?6.0 100 Wrist - ADM 7    31.3     B.Elbow ADM 6.6  9.6  91.6 B.Elbow - Wrist 20 3.8 53 ?49 28.9     A.Elbow ADM 8.6  10.2  107 A.Elbow - B.Elbow 11 2.0 54 ?49 30.7           A.Elbow - Wrist  5.8        F  Wave    Nerve F Lat Ref.   ms ms  R Ulnar - ADM 27.8 ?32.0  L Ulnar - ADM 29.4 ?32.0     EMG full                                 Naomie DeanAntonia Skylarr Liz, MD  [facility] [address] [place], [place] [id number]  Phone [phone] Fax [phone]

## 2016-03-01 DIAGNOSIS — E782 Mixed hyperlipidemia: Secondary | ICD-10-CM | POA: Diagnosis not present

## 2016-03-01 DIAGNOSIS — E1151 Type 2 diabetes mellitus with diabetic peripheral angiopathy without gangrene: Secondary | ICD-10-CM | POA: Diagnosis not present

## 2016-03-01 DIAGNOSIS — I70219 Atherosclerosis of native arteries of extremities with intermittent claudication, unspecified extremity: Secondary | ICD-10-CM | POA: Diagnosis not present

## 2016-03-01 DIAGNOSIS — I2510 Atherosclerotic heart disease of native coronary artery without angina pectoris: Secondary | ICD-10-CM | POA: Diagnosis not present

## 2016-05-25 DIAGNOSIS — I70219 Atherosclerosis of native arteries of extremities with intermittent claudication, unspecified extremity: Secondary | ICD-10-CM | POA: Diagnosis not present

## 2016-06-03 DIAGNOSIS — E782 Mixed hyperlipidemia: Secondary | ICD-10-CM | POA: Diagnosis not present

## 2016-06-03 DIAGNOSIS — I2510 Atherosclerotic heart disease of native coronary artery without angina pectoris: Secondary | ICD-10-CM | POA: Diagnosis not present

## 2016-06-03 DIAGNOSIS — I70219 Atherosclerosis of native arteries of extremities with intermittent claudication, unspecified extremity: Secondary | ICD-10-CM | POA: Diagnosis not present

## 2016-06-03 DIAGNOSIS — E1151 Type 2 diabetes mellitus with diabetic peripheral angiopathy without gangrene: Secondary | ICD-10-CM | POA: Diagnosis not present

## 2016-06-09 DIAGNOSIS — E118 Type 2 diabetes mellitus with unspecified complications: Secondary | ICD-10-CM | POA: Diagnosis not present

## 2016-06-09 DIAGNOSIS — M1990 Unspecified osteoarthritis, unspecified site: Secondary | ICD-10-CM | POA: Diagnosis not present

## 2016-06-09 DIAGNOSIS — M19041 Primary osteoarthritis, right hand: Secondary | ICD-10-CM | POA: Diagnosis not present

## 2016-06-16 DIAGNOSIS — M1990 Unspecified osteoarthritis, unspecified site: Secondary | ICD-10-CM | POA: Diagnosis not present

## 2016-06-16 DIAGNOSIS — M79643 Pain in unspecified hand: Secondary | ICD-10-CM | POA: Diagnosis not present

## 2016-06-21 ENCOUNTER — Emergency Department (HOSPITAL_COMMUNITY): Payer: Medicare Other

## 2016-06-21 ENCOUNTER — Emergency Department (HOSPITAL_COMMUNITY)
Admission: EM | Admit: 2016-06-21 | Discharge: 2016-06-21 | Disposition: A | Discharge status: Home / Self Care | Payer: Medicare Other | Attending: Emergency Medicine | Admitting: Emergency Medicine

## 2016-06-21 ENCOUNTER — Encounter (HOSPITAL_COMMUNITY): Payer: Self-pay | Admitting: Emergency Medicine

## 2016-06-21 DIAGNOSIS — I10 Essential (primary) hypertension: Secondary | ICD-10-CM | POA: Diagnosis not present

## 2016-06-21 DIAGNOSIS — N132 Hydronephrosis with renal and ureteral calculous obstruction: Secondary | ICD-10-CM | POA: Insufficient documentation

## 2016-06-21 DIAGNOSIS — Z794 Long term (current) use of insulin: Secondary | ICD-10-CM | POA: Insufficient documentation

## 2016-06-21 DIAGNOSIS — Z87891 Personal history of nicotine dependence: Secondary | ICD-10-CM | POA: Insufficient documentation

## 2016-06-21 DIAGNOSIS — I2510 Atherosclerotic heart disease of native coronary artery without angina pectoris: Secondary | ICD-10-CM | POA: Insufficient documentation

## 2016-06-21 DIAGNOSIS — N202 Calculus of kidney with calculus of ureter: Secondary | ICD-10-CM | POA: Diagnosis not present

## 2016-06-21 DIAGNOSIS — E119 Type 2 diabetes mellitus without complications: Secondary | ICD-10-CM | POA: Insufficient documentation

## 2016-06-21 DIAGNOSIS — Z7982 Long term (current) use of aspirin: Secondary | ICD-10-CM | POA: Insufficient documentation

## 2016-06-21 DIAGNOSIS — N200 Calculus of kidney: Secondary | ICD-10-CM

## 2016-06-21 DIAGNOSIS — Z951 Presence of aortocoronary bypass graft: Secondary | ICD-10-CM | POA: Insufficient documentation

## 2016-06-21 DIAGNOSIS — R109 Unspecified abdominal pain: Secondary | ICD-10-CM | POA: Diagnosis present

## 2016-06-21 LAB — COMPREHENSIVE METABOLIC PANEL
ALT: 21 U/L (ref 17–63)
AST: 24 U/L (ref 15–41)
Albumin: 4.4 g/dL (ref 3.5–5.0)
Alkaline Phosphatase: 50 U/L (ref 38–126)
Anion gap: 11 (ref 5–15)
BUN: 23 mg/dL — AB (ref 6–20)
CHLORIDE: 104 mmol/L (ref 101–111)
CO2: 24 mmol/L (ref 22–32)
Calcium: 9.3 mg/dL (ref 8.9–10.3)
Creatinine, Ser: 1.17 mg/dL (ref 0.61–1.24)
GFR calc Af Amer: >60 mL/min (ref >60)
GFR calc non Af Amer: >60 mL/min (ref >60)
Glucose, Bld: 125 mg/dL — ABNORMAL HIGH (ref 65–99)
POTASSIUM: 4.4 mmol/L (ref 3.5–5.1)
Sodium: 139 mmol/L (ref 135–145)
Total Bilirubin: 0.7 mg/dL (ref 0.3–1.2)
Total Protein: 7.6 g/dL (ref 6.5–8.1)

## 2016-06-21 LAB — CBC WITH DIFFERENTIAL/PLATELET
Basophils Absolute: 0.1 10*3/uL (ref 0.0–0.1)
Basophils Relative: 1 %
EOS PCT: 5 %
Eosinophils Absolute: 0.4 10*3/uL (ref 0.0–0.7)
HCT: 44.9 % (ref 39.0–52.0)
Hemoglobin: 15.4 g/dL (ref 13.0–17.0)
LYMPHS PCT: 37 %
Lymphs Abs: 2.6 10*3/uL (ref 0.7–4.0)
MCH: 33.7 pg (ref 26.0–34.0)
MCHC: 34.3 g/dL (ref 30.0–36.0)
MCV: 98.2 fL (ref 78.0–100.0)
MONO ABS: 0.7 10*3/uL (ref 0.1–1.0)
Monocytes Relative: 10 %
Neutro Abs: 3.3 10*3/uL (ref 1.7–7.7)
Neutrophils Relative %: 47 %
PLATELETS: 215 10*3/uL (ref 150–400)
RBC: 4.57 MIL/uL (ref 4.22–5.81)
RDW: 13.8 % (ref 11.5–15.5)
WBC: 7 10*3/uL (ref 4.0–10.5)

## 2016-06-21 LAB — URINALYSIS, ROUTINE W REFLEX MICROSCOPIC
Bacteria, UA: NONE SEEN
Bilirubin Urine: NEGATIVE
Glucose, UA: >=500 mg/dL — AB
Hgb urine dipstick: NEGATIVE
KETONES UR: NEGATIVE mg/dL
LEUKOCYTES UA: NEGATIVE
Nitrite: NEGATIVE
Protein, ur: NEGATIVE mg/dL
SQUAMOUS EPITHELIAL / LPF: NONE SEEN
Specific Gravity, Urine: 1.017 (ref 1.005–1.030)
pH: 5 (ref 5.0–8.0)

## 2016-06-21 MED ORDER — SODIUM CHLORIDE 0.9 % IV SOLN
Freq: Once | INTRAVENOUS | Status: AC
  Administered 2016-06-21: 19:00:00 via INTRAVENOUS

## 2016-06-21 MED ORDER — KETOROLAC TROMETHAMINE 15 MG/ML IJ SOLN
15.0000 mg | Freq: Once | INTRAMUSCULAR | Status: AC | PRN
  Administered 2016-06-21: 15 mg via INTRAVENOUS
  Filled 2016-06-21: qty 1

## 2016-06-21 MED ORDER — IBUPROFEN 600 MG PO TABS: 600.0000 mg | ORAL_TABLET | Freq: Three times a day (TID) | ORAL | 0 refills | Status: DC | PRN

## 2016-06-21 MED ORDER — ONDANSETRON HCL 4 MG/2ML IJ SOLN: 4.0000 mg | Freq: Once | INTRAMUSCULAR | Status: DC | PRN

## 2016-06-21 MED ORDER — ONDANSETRON 4 MG PO TBDP: 4.0000 mg | ORAL_TABLET | Freq: Three times a day (TID) | ORAL | 0 refills | Status: AC | PRN

## 2016-06-21 NOTE — ED Notes (Signed)
Tolerating oral fluids well.  MD aware.

## 2016-06-21 NOTE — ED Provider Notes (Signed)
[facility]-EMERGENCY DEPT Provider Note   CSN: [id number] Arrival date & time: 06/21/16  1707     History   Chief Complaint Chief Complaint  Patient presents with  . Flank Pain    HPI Cory Clarke is a 66 y.o. male.  The history is provided by the patient and medical records. No language interpreter was used.  Flank Pain  Pertinent negatives include no abdominal pain.   Cory Clarke is a 66 y.o. male  with a PMH of HTN, HLD, DM, CAD, prior kidney stones who presents to the Emergency Department complaining of gradually worsening right flank pain which began around 4 pm today and will intermittently radiate to right low back. No medications taken prior to arrival. Pain feels better when he is pacing around the room. He had kidney stones about 3 weeks ago. He also endorses stones prior to that, although it was several years ago. Never required surgical intervention. No nausea or vomiting. No dysuria, urinary urgency/frequency/dribbling. No fevers/chills.    Past Medical History:  Diagnosis Date  . Abnormal cardiovascular stress test   . Arthritis    "shoulders and knees" (09/02/2014)  . Coronary artery disease   . GERD (gastroesophageal reflux disease)    ONCE IN AWHILE  . Hyperlipemia   . Hypertension    under control; has been on med. x 1 yr.  . Kidney stones   . Peripheral vascular disease (HCC)    blockages in legs  . Rotator cuff tear, left    AC joint DJD  . Sleep apnea 3-4 YRS AGO   no CPAP - did not return for follow-up after sleep study  . Tuberculosis    + SKIN TEST  . Type II diabetes mellitus (HCC)    IDDM    Patient Active Problem List   Diagnosis Date Noted  . Claudication in peripheral vascular disease (HCC) 08/16/2011  . CAD (coronary artery disease), native coronary artery 08/16/2011  . Hyperlipidemia 08/16/2011  . Essential hypertension, benign 08/16/2011  . S/P CABG (coronary artery bypass graft) 05/23/2011    Past Surgical History:  Procedure  Laterality Date  . ABDOMINAL ANGIOGRAM  01/28/2014   Procedure: ABDOMINAL ANGIOGRAM;  Surgeon: [name], MD;  Location: [facility] CATH LAB;  Service: Cardiovascular;;  . ABDOMINAL AORTAGRAM N/A 05/10/2011   Procedure: ABDOMINAL Ronny Flurry;  Surgeon: [name], MD;  Location: [facility] CATH LAB;  Service: Cardiovascular;  Laterality: N/A;  . CARDIAC CATHETERIZATION  04/11/11;  04/2011  . CORONARY ANGIOGRAM  05/10/2011   Procedure: CORONARY ANGIOGRAM;  Surgeon: [name], MD;  Location: [facility] CATH LAB;  Service: Cardiovascular;;  . CORONARY ANGIOPLASTY    . CORONARY ARTERY BYPASS GRAFT  05/23/2011   Procedure: CORONARY ARTERY BYPASS GRAFTING (CABG);  Surgeon: Loreli Slot, MD;  Location: [facility] OR;  Service: Open Heart Surgery;  Laterality: N/A;  Times 5. Using endoscopically harvested right  greater saphenous vein and left internal mammary artery.   Marland Kitchen KNEE ARTHROSCOPY Left 1980's  . LEFT HEART CATHETERIZATION WITH CORONARY ANGIOGRAM N/A 04/12/2011   Procedure: LEFT HEART CATHETERIZATION WITH CORONARY ANGIOGRAM;  Surgeon: [name], MD;  Location: [facility] CATH LAB;  Service: Cardiovascular;  Laterality: N/A;  . LOWER EXTREMITY ANGIOGRAM  01/29/2013   DR Jacinto Halim  . LOWER EXTREMITY ANGIOGRAM N/A 05/10/2011   Procedure: LOWER EXTREMITY ANGIOGRAM;  Surgeon: [name], MD;  Location: [facility] CATH LAB;  Service: Cardiovascular;  Laterality: N/A;  . LOWER EXTREMITY ANGIOGRAM N/A 08/16/2011  Procedure: LOWER EXTREMITY ANGIOGRAM;  Surgeon: [name], MD;  Location: [facility] CATH LAB;  Service: Cardiovascular;  Laterality: N/A;  . LOWER EXTREMITY ANGIOGRAM N/A 12/13/2011   Procedure: LOWER EXTREMITY ANGIOGRAM;  Surgeon: [name], MD;  Location: [facility] CATH LAB;  Service: Cardiovascular;  Laterality: N/A;  . LOWER EXTREMITY ANGIOGRAM N/A 02/14/2012   Procedure: LOWER EXTREMITY ANGIOGRAM;  Surgeon: [name], MD;  Location: [facility] CATH LAB;  Service: Cardiovascular;  Laterality: N/A;  .  LOWER EXTREMITY ANGIOGRAM N/A 05/01/2012   Procedure: LOWER EXTREMITY ANGIOGRAM;  Surgeon: [name], MD;  Location: [facility] CATH LAB;  Service: Cardiovascular;  Laterality: N/A;  . LOWER EXTREMITY ANGIOGRAM N/A 01/29/2013   Procedure: LOWER EXTREMITY ANGIOGRAM;  Surgeon: [name], MD;  Location: [facility] CATH LAB;  Service: Cardiovascular;  Laterality: N/A;  . LOWER EXTREMITY ANGIOGRAM N/A 06/04/2013   Procedure: LOWER EXTREMITY ANGIOGRAM;  Surgeon: [name], MD;  Location: [facility] CATH LAB;  Service: Cardiovascular;  Laterality: N/A;  . LOWER EXTREMITY ANGIOGRAM Right 07/09/2013   Procedure: LOWER EXTREMITY ANGIOGRAM;  Surgeon: [name], MD;  Location: [facility] CATH LAB;  Service: Cardiovascular;  Laterality: Right;  . LOWER EXTREMITY ANGIOGRAM N/A 01/28/2014   Procedure: LOWER EXTREMITY ANGIOGRAM;  Surgeon: [name], MD;  Location: [facility] CATH LAB;  Service: Cardiovascular;  Laterality: N/A;  . PERCUTANEOUS STENT INTERVENTION Left 05/01/2012   Procedure: PERCUTANEOUS STENT INTERVENTION;  Surgeon: [name], MD;  Location: [facility] CATH LAB;  Service: Cardiovascular;  Laterality: Left;  . PERIPHERAL VASCULAR CATHETERIZATION N/A 09/02/2014   Procedure: Lower Extremity Angiography;  Surgeon: [name], MD;  Location: [facility] INVASIVE CV LAB;  Service: Cardiovascular;  Laterality: N/A;       Home Medications    Prior to Admission medications   Medication Sig Start Date End Date Taking? Authorizing Provider  acetaminophen (TYLENOL) 500 MG tablet Take 1,000 mg by mouth 3 (three) times daily as needed for mild pain or moderate pain.   Yes Historical Provider, MD  aspirin EC 81 MG tablet Take 81 mg by mouth every evening.    Yes Historical Provider, MD  atorvastatin (LIPITOR) 40 MG tablet Take 40 mg by mouth at bedtime.    Yes Historical Provider, MD  B Complex Vitamins (VITAMIN-B COMPLEX) TABS Take 1 tablet by mouth 3 (three) times daily.  12/25/09  Yes Historical Provider, MD    Cholecalciferol (VITAMIN D) 2000 UNITS CAPS Take 2,000 Units by mouth daily.   Yes Historical Provider, MD  clopidogrel (PLAVIX) 75 MG tablet Take 75 mg by mouth daily with breakfast.   Yes Historical Provider, MD  Dapagliflozin-Metformin HCl ER (XIGDUO XR) 07-998 MG TB24 Take 1 tablet by mouth daily. Patient taking differently: Take 1 tablet by mouth 2 (two) times daily.  09/04/14  Yes [name], MD  fenofibrate 54 MG tablet Take 54 mg by mouth every evening.    Yes Historical Provider, MD  Icosapent Ethyl (VASCEPA) 1 G CAPS Take 1 g by mouth 3 (three) times daily.    Yes Historical Provider, MD  insulin aspart (NOVOLOG) 100 UNIT/ML injection Inject 6-30 Units into the skin 4 (four) times daily. Pt prefers to gauge his own sliding scale: 6-8 units with each meal, 30 units around 11pm   Yes Historical Provider, MD  LEVEMIR 100 UNIT/ML injection Inject 26 units two times a day (10am and 10pm) 04/13/15  Yes Historical Provider, MD  propranolol (INDERAL) 40 MG tablet Take 40 mg by mouth 3 (  three) times daily.   Yes Donielle Margaretann Loveless, PA-C  ramipril (ALTACE) 5 MG capsule Take 5 mg by mouth daily.   Yes Historical Provider, MD  Vorapaxar Sulfate (ZONTIVITY) 2.08 MG TABS Take 2.08 mg by mouth daily.   Yes Historical Provider, MD  ibuprofen (ADVIL,MOTRIN) 600 MG tablet Take 1 tablet (600 mg total) by mouth every 8 (eight) hours as needed. 06/21/16   Chase Picket Johnston Maddocks, PA-C  Naphazoline HCl (CLEAR EYES OP) Apply 2 drops to eye 2 (two) times daily as needed (for allergy season). 2 drops in each eye    Historical Provider, MD  ondansetron (ZOFRAN ODT) 4 MG disintegrating tablet Take 1 tablet (4 mg total) by mouth every 8 (eight) hours as needed for nausea or vomiting. 06/21/16   Chase Picket Annamarie Yamaguchi, PA-C    Family History No family history on file.  Social History Social History  Substance Use Topics  . Smoking status: Former Smoker    Packs/day: 2.00    Years: 40.00    Types: Cigarettes    Quit  date: 05/22/2011  . Smokeless tobacco: Never Used  . Alcohol use 3.6 oz/week    6 Glasses of wine per week     Allergies   Tape   Review of Systems Review of Systems  Gastrointestinal: Negative for abdominal pain.  Genitourinary: Positive for flank pain.  Musculoskeletal: Positive for back pain.  All other systems reviewed and are negative.    Physical Exam Updated Vital Signs BP (!) 166/78 (BP Location: Right Arm)   Pulse 64   Temp 97.9 F (36.6 C) (Oral)   Resp 19   Ht  (1.702 m)   Wt 90.7 kg   SpO2 96%   BMI 31.32 kg/m   Physical Exam  Constitutional: He is oriented to person, place, and time. He appears well-developed and well-nourished. No distress.  HENT:  Head: Normocephalic and atraumatic.  Cardiovascular: Normal rate, regular rhythm and normal heart sounds.   No murmur heard. Pulmonary/Chest: Effort normal and breath sounds normal. No respiratory distress.  Abdominal: Soft. Bowel sounds are normal. He exhibits no distension.  No CVA or abdominal tenderness. Mild right flank tenderness with no rebound or guarding. No overlying skin changes.   Musculoskeletal: He exhibits no edema.  Neurological: He is alert and oriented to person, place, and time.  Skin: Skin is warm and dry.  Nursing note and vitals reviewed.    ED Treatments / Results  Labs (all labs ordered are listed, but only abnormal results are displayed) Labs Reviewed  URINALYSIS, ROUTINE W REFLEX MICROSCOPIC - Abnormal; Notable for the following:       Result Value   Color, Urine STRAW (*)    Glucose, UA >=500 (*)    All other components within normal limits  COMPREHENSIVE METABOLIC PANEL - Abnormal; Notable for the following:    Glucose, Bld 125 (*)    BUN 23 (*)    All other components within normal limits  CBC WITH DIFFERENTIAL/PLATELET    EKG  EKG Interpretation None       Radiology Ct Renal Stone Study  Result Date: 06/21/2016 CLINICAL DATA:  Right flank pain since 4  p.m. patient states he also passed a renal stone on 06/11/2016. EXAM: CT ABDOMEN AND PELVIS WITHOUT CONTRAST TECHNIQUE: Multidetector CT imaging of the abdomen and pelvis was performed following the standard protocol without IV contrast. COMPARISON:  06/08/2015 CT FINDINGS: Lower chest: No acute abnormality. Hepatobiliary: No focal liver abnormality is seen.  No gallstones, gallbladder wall thickening, or biliary dilatation. Pancreas: Unremarkable. No pancreatic ductal dilatation or surrounding inflammatory changes. Spleen: A small splenule is seen adjacent to the anterior aspect of the spleen unchanged in appearance measuring 2.3 cm. Adrenals/Urinary Tract: Normal bilateral adrenal glands. Bilateral renal calcifications more vascular in appearance on the left than on prior. There is mild to moderate right-sided hydroureteronephrosis secondary to a distal 3 mm calculus approximate 1 cm from the UVJ and a second which appears to have almost completely if not completely passed into the bladder through the interstitial portion of the right ureter. The largest calcification remaining is in the lower pole the right kidney measuring up to 11 mm, increased in size 7 mm previously. No left-sided obstruction. Nonspecific mild perinephric fat stranding is again noted which appears chronic. The urinary bladder is physiologically distended. Stomach/Bowel: Unremarkable stomach. No small bowel obstruction although there are a few small bowel loops containing air-fluid possibly representing a mild localized ileus. Submucosal fatty changes are seen the terminal ileum and cecum possibly likely representing chronic changes bowel inflammation. Vascular/Lymphatic: Diffuse aortoiliac atherosclerosis with calcifications. No lymphadenopathy. Reproductive: Normal size prostate and seminal vesicles. Other: Small fat containing umbilical hernia. Mild subcutaneous fatty induration overlying the no intra lower quadrants of the abdomen  bilaterally, chronic in appearance and possibly due to administration of subcutaneous medication. Musculoskeletal: No acute or significant osseous findings. IMPRESSION: 1. Mild-to-moderate right-sided hydroureteronephrosis attributable to tandem calculi in the distal right ureter, one which has or almost has passed completely into the bladder measuring 4 mm and a more proximal 3 mm calcification seen approximately 1 cm from the UVJ. 2. Bilateral nephrolithiasis as before, the largest is in the lower pole the right kidney now estimated at 11 mm maximum. 3. Slight fluid-filled and gas-filled distention of small bowel loops may represent generalized small bowel ileus due to the ureteral obstruction Electronically Signed   By: Tollie Eth M.D.   On: 06/21/2016 19:00    Procedures Procedures (including critical care time)  Medications Ordered in ED Medications  ondansetron (ZOFRAN) injection 4 mg (not administered)  0.9 %  sodium chloride infusion ( Intravenous Stopped 06/21/16 2036)  ketorolac (TORADOL) 15 MG/ML injection 15 mg (15 mg Intravenous Given 06/21/16 1848)     Initial Impression / Assessment and Plan / ED Course  I have reviewed the triage vital signs and the nursing notes.  Pertinent labs & imaging results that were available during my care of the patient were reviewed by me and considered in my medical decision making (see chart for details).    ASHWATH LASCH is a 66 y.o. male who presents to ED for right flank pain similar to pain he experienced with kidney stones in the past. On exam, patient is afebrile, hemodynamically stable with benign abdominal exam. No CVA tenderness. Does have mild tenderness along the right flank. No urinary symptoms and UA with no signs of infection. Labs reviewed and reassuring. CT renal stone study shows mild to moderate right-sided hydronephrosis due to stone sin distal right ureter. Imaging tests show slight fluid-filled and gas-filled distention of small  bowel loops believed to represent small bowel ileus. Informed patient of imaging results. He states that he just had a bowel movement while in the emergency department today. He has had no vomiting. He is tolerating PO and pain is well controlled. Patient appears safe for discharge home and is agreeable to this plan. Symptomatic home care instructions and return precautions discussed. Primary care follow-up strongly  recommended. All questions answered.  Patient discussed with Dr. Lynelle Doctor who agrees with treatment plan.    Final Clinical Impressions(s) / ED Diagnoses   Final diagnoses:  Kidney stone    New Prescriptions Discharge Medication List as of 06/21/2016  8:34 PM    START taking these medications   Details  ibuprofen (ADVIL,MOTRIN) 600 MG tablet Take 1 tablet (600 mg total) by mouth every 8 (eight) hours as needed., Starting Tue 06/21/2016, Print    ondansetron (ZOFRAN ODT) 4 MG disintegrating tablet Take 1 tablet (4 mg total) by mouth every 8 (eight) hours as needed for nausea or vomiting., Starting Tue 06/21/2016, Print         [facility] Maksim Peregoy, PA-C 06/21/16 2221    Linwood Dibbles, MD 06/22/16 2044

## 2016-06-21 NOTE — ED Notes (Signed)
Pt had a cup of water and tolerate well

## 2016-06-21 NOTE — ED Notes (Signed)
Pt reminded of need for urine 

## 2016-06-21 NOTE — ED Notes (Signed)
Pt given urinal and made aware of need for urine specimen 

## 2016-06-21 NOTE — ED Notes (Signed)
Pt ambulated to restroom. 

## 2016-06-21 NOTE — ED Triage Notes (Signed)
Patient c/o right flank pain that started around 4pm today. Patient states that he had kidney stone couple months ago in same area. patient denies any urinary problems.

## 2016-06-21 NOTE — ED Notes (Signed)
Pt ambulatory and independent at discharge.  Verbalized understanding of discharge instructions 

## 2016-06-21 NOTE — Discharge Instructions (Signed)
You have been diagnosed with kidney stones.  Continue to drink fluids to help you pass the stones. Use Zofran for nausea as directed. Increase hydration. Follow up with the urology clinic listed in regards to your hospital visit.  Return to the ED immediately if you develop fever that persists > 101, uncontrolled pain or vomiting, or other concerns.  Read the instructions below to learn more about kidney stones.    Kidney Stones Kidney stones (ureteral lithiasis) are solid masses that form inside your kidneys. The intense pain is caused by the stone moving through the kidney, ureter, bladder, and urethra (urinary tract). When the stone moves, the ureter starts to spasm around the stone. The stone is usually passed in the urine.   HOME CARE Drink enough fluids to keep your urine clear or pale yellow. This helps to get the stone out.  Only take medicine as told by your doctor.  Follow up with your doctor as told.   GET HELP RIGHT AWAY IF:  Your pain does not get better with medicine.  You have a fever.  Your pain increases and gets worse over 18 hours.  You have new belly (abdominal) pain.  You feel faint or pass out.   MAKE SURE YOU:  Understand these instructions.  Will watch your condition.  Will get help right away if you are not doing well or get worse.

## 2016-08-02 DIAGNOSIS — I70219 Atherosclerosis of native arteries of extremities with intermittent claudication, unspecified extremity: Secondary | ICD-10-CM | POA: Diagnosis not present

## 2016-08-16 DIAGNOSIS — M1990 Unspecified osteoarthritis, unspecified site: Secondary | ICD-10-CM | POA: Diagnosis not present

## 2016-08-16 DIAGNOSIS — M79643 Pain in unspecified hand: Secondary | ICD-10-CM | POA: Diagnosis not present

## 2016-08-18 DIAGNOSIS — E1151 Type 2 diabetes mellitus with diabetic peripheral angiopathy without gangrene: Secondary | ICD-10-CM | POA: Diagnosis not present

## 2016-08-18 DIAGNOSIS — E669 Obesity, unspecified: Secondary | ICD-10-CM | POA: Diagnosis not present

## 2016-08-18 DIAGNOSIS — I70219 Atherosclerosis of native arteries of extremities with intermittent claudication, unspecified extremity: Secondary | ICD-10-CM | POA: Diagnosis not present

## 2016-08-18 DIAGNOSIS — I2510 Atherosclerotic heart disease of native coronary artery without angina pectoris: Secondary | ICD-10-CM | POA: Diagnosis not present

## 2016-09-07 DIAGNOSIS — H10413 Chronic giant papillary conjunctivitis, bilateral: Secondary | ICD-10-CM | POA: Diagnosis not present

## 2016-09-07 DIAGNOSIS — H2513 Age-related nuclear cataract, bilateral: Secondary | ICD-10-CM | POA: Diagnosis not present

## 2016-09-07 DIAGNOSIS — E119 Type 2 diabetes mellitus without complications: Secondary | ICD-10-CM | POA: Diagnosis not present

## 2016-09-21 ENCOUNTER — Emergency Department (HOSPITAL_COMMUNITY)
Admission: EM | Admit: 2016-09-21 | Discharge: 2016-09-21 | Disposition: A | Discharge status: Home / Self Care | Payer: Medicare Other | Attending: Emergency Medicine | Admitting: Emergency Medicine

## 2016-09-21 ENCOUNTER — Encounter (HOSPITAL_COMMUNITY): Payer: Self-pay | Admitting: Emergency Medicine

## 2016-09-21 DIAGNOSIS — E119 Type 2 diabetes mellitus without complications: Secondary | ICD-10-CM | POA: Diagnosis not present

## 2016-09-21 DIAGNOSIS — Z79899 Other long term (current) drug therapy: Secondary | ICD-10-CM | POA: Diagnosis not present

## 2016-09-21 DIAGNOSIS — Z7982 Long term (current) use of aspirin: Secondary | ICD-10-CM | POA: Diagnosis not present

## 2016-09-21 DIAGNOSIS — R1031 Right lower quadrant pain: Secondary | ICD-10-CM | POA: Diagnosis present

## 2016-09-21 DIAGNOSIS — Z794 Long term (current) use of insulin: Secondary | ICD-10-CM | POA: Diagnosis not present

## 2016-09-21 DIAGNOSIS — N23 Unspecified renal colic: Secondary | ICD-10-CM | POA: Diagnosis not present

## 2016-09-21 DIAGNOSIS — Z951 Presence of aortocoronary bypass graft: Secondary | ICD-10-CM | POA: Insufficient documentation

## 2016-09-21 DIAGNOSIS — I2510 Atherosclerotic heart disease of native coronary artery without angina pectoris: Secondary | ICD-10-CM | POA: Diagnosis not present

## 2016-09-21 DIAGNOSIS — I10 Essential (primary) hypertension: Secondary | ICD-10-CM | POA: Diagnosis not present

## 2016-09-21 DIAGNOSIS — Z87891 Personal history of nicotine dependence: Secondary | ICD-10-CM | POA: Insufficient documentation

## 2016-09-21 DIAGNOSIS — Z7902 Long term (current) use of antithrombotics/antiplatelets: Secondary | ICD-10-CM | POA: Diagnosis not present

## 2016-09-21 DIAGNOSIS — N201 Calculus of ureter: Secondary | ICD-10-CM | POA: Diagnosis not present

## 2016-09-21 LAB — COMPREHENSIVE METABOLIC PANEL
ALBUMIN: 4.2 g/dL (ref 3.5–5.0)
ALK PHOS: 42 U/L (ref 38–126)
ALT: 20 U/L (ref 17–63)
ANION GAP: 11 (ref 5–15)
AST: 21 U/L (ref 15–41)
BILIRUBIN TOTAL: 0.6 mg/dL (ref 0.3–1.2)
BUN: 15 mg/dL (ref 6–20)
CALCIUM: 8.9 mg/dL (ref 8.9–10.3)
CO2: 23 mmol/L (ref 22–32)
Chloride: 104 mmol/L (ref 101–111)
Creatinine, Ser: 1.12 mg/dL (ref 0.61–1.24)
GLUCOSE: 199 mg/dL — AB (ref 65–99)
POTASSIUM: 4.9 mmol/L (ref 3.5–5.1)
Sodium: 138 mmol/L (ref 135–145)
TOTAL PROTEIN: 6.9 g/dL (ref 6.5–8.1)

## 2016-09-21 LAB — URINALYSIS, ROUTINE W REFLEX MICROSCOPIC
Bacteria, UA: NONE SEEN
Bilirubin Urine: NEGATIVE
Hgb urine dipstick: NEGATIVE
KETONES UR: NEGATIVE mg/dL
Leukocytes, UA: NEGATIVE
NITRITE: NEGATIVE
PH: 5 (ref 5.0–8.0)
Protein, ur: NEGATIVE mg/dL
RBC / HPF: NONE SEEN RBC/hpf (ref 0–5)
Specific Gravity, Urine: 1.011 (ref 1.005–1.030)
Squamous Epithelial / LPF: NONE SEEN

## 2016-09-21 LAB — CBC WITH DIFFERENTIAL/PLATELET
BASOS PCT: 1 %
Basophils Absolute: 0 10*3/uL (ref 0.0–0.1)
Eosinophils Absolute: 0.3 10*3/uL (ref 0.0–0.7)
Eosinophils Relative: 5 %
HEMATOCRIT: 42.9 % (ref 39.0–52.0)
HEMOGLOBIN: 14.7 g/dL (ref 13.0–17.0)
LYMPHS ABS: 1.7 10*3/uL (ref 0.7–4.0)
Lymphocytes Relative: 28 %
MCH: 33.9 pg (ref 26.0–34.0)
MCHC: 34.3 g/dL (ref 30.0–36.0)
MCV: 98.8 fL (ref 78.0–100.0)
MONOS PCT: 9 %
Monocytes Absolute: 0.6 10*3/uL (ref 0.1–1.0)
NEUTROS ABS: 3.5 10*3/uL (ref 1.7–7.7)
NEUTROS PCT: 57 %
Platelets: 193 10*3/uL (ref 150–400)
RBC: 4.34 MIL/uL (ref 4.22–5.81)
RDW: 13.1 % (ref 11.5–15.5)
WBC: 6 10*3/uL (ref 4.0–10.5)

## 2016-09-21 MED ORDER — SODIUM CHLORIDE 0.9 % IV BOLUS (SEPSIS)
1000.0000 mL | Freq: Once | INTRAVENOUS | Status: AC
  Administered 2016-09-21: 1000 mL via INTRAVENOUS

## 2016-09-21 MED ORDER — HYDROCODONE-ACETAMINOPHEN 5-325 MG PO TABS: 1.0000 | ORAL_TABLET | Freq: Four times a day (QID) | ORAL | 0 refills | Status: AC | PRN

## 2016-09-21 MED ORDER — KETOROLAC TROMETHAMINE 15 MG/ML IJ SOLN
15.0000 mg | Freq: Once | INTRAMUSCULAR | Status: AC
  Administered 2016-09-21: 15 mg via INTRAVENOUS

## 2016-09-21 MED ORDER — KETOROLAC TROMETHAMINE 30 MG/ML IJ SOLN
30.0000 mg | Freq: Once | INTRAMUSCULAR | Status: DC
  Filled 2016-09-21: qty 1

## 2016-09-21 MED ORDER — IBUPROFEN 600 MG PO TABS: 600.0000 mg | ORAL_TABLET | Freq: Four times a day (QID) | ORAL | 0 refills | Status: AC | PRN

## 2016-09-21 NOTE — Discharge Instructions (Signed)
It was my pleasure taking care of you today!   You have been diagnosed with kidney stones. Drink plenty of fluids to help you pass the stone.  Take ibuprofen as directed with food for mild to moderate pain. Use your pain medication as directed and only as needed for severe pain.  It is very important that you call the urology clinic today to schedule a follow up appointment.  Return to the ED immediately if you develop fever that persists > 101, uncontrolled pain or vomiting, or other concerns.   Do not drink alcohol, drive or participate in any other potentially dangerous activities while taking opiate pain medication as it may make you sleepy. Do not take this medication with any other sedating medications, either prescription or over-the-counter. If you were prescribed Percocet or Vicodin, do not take these with acetaminophen (Tylenol) as it is already contained within these medications.

## 2016-09-21 NOTE — ED Provider Notes (Signed)
[facility]-EMERGENCY DEPT Provider Note   CSN: [id number] Arrival date & time: 09/21/16  1027     History   Chief Complaint Chief Complaint  Patient presents with  . Flank Pain    HPI Cory Clarke is a 66 y.o. male.  The history is provided by the patient and medical records. No language interpreter was used.  Flank Pain    Cory Clarke is a 65 y.o. male  with a PMH of kidney stones who presents to the Emergency Department complaining of acute onset of waxing-waning right flank pain which began this morning. He reports that pain today feels similar to his 2 prior kidney stones this year. He has had no nausea or vomiting. No fever/chills. No medications taken prior to arrival for his symptoms. No alleviating or aggravating factors noted.    Past Medical History:  Diagnosis Date  . Abnormal cardiovascular stress test   . Arthritis    "shoulders and knees" (09/02/2014)  . Coronary artery disease   . GERD (gastroesophageal reflux disease)    ONCE IN AWHILE  . Hyperlipemia   . Hypertension    under control; has been on med. x 1 yr.  . Kidney stones   . Peripheral vascular disease (HCC)    blockages in legs  . Rotator cuff tear, left    AC joint DJD  . Sleep apnea 3-4 YRS AGO   no CPAP - did not return for follow-up after sleep study  . Tuberculosis    + SKIN TEST  . Type II diabetes mellitus (HCC)    IDDM    Patient Active Problem List   Diagnosis Date Noted  . Claudication in peripheral vascular disease (HCC) 08/16/2011  . CAD (coronary artery disease), native coronary artery 08/16/2011  . Hyperlipidemia 08/16/2011  . Essential hypertension, benign 08/16/2011  . S/P CABG (coronary artery bypass graft) 05/23/2011    Past Surgical History:  Procedure Laterality Date  . ABDOMINAL ANGIOGRAM  01/28/2014   Procedure: ABDOMINAL ANGIOGRAM;  Surgeon: [name], MD;  Location: [facility] CATH LAB;  Service: Cardiovascular;;  . ABDOMINAL AORTAGRAM N/A 05/10/2011   Procedure: ABDOMINAL Ronny Flurry;  Surgeon: [name], MD;  Location: [facility] CATH LAB;  Service: Cardiovascular;  Laterality: N/A;  . CARDIAC CATHETERIZATION  04/11/11;  04/2011  . CORONARY ANGIOGRAM  05/10/2011   Procedure: CORONARY ANGIOGRAM;  Surgeon: [name], MD;  Location: [facility] CATH LAB;  Service: Cardiovascular;;  . CORONARY ANGIOPLASTY    . CORONARY ARTERY BYPASS GRAFT  05/23/2011   Procedure: CORONARY ARTERY BYPASS GRAFTING (CABG);  Surgeon: Loreli Slot, MD;  Location: [facility] OR;  Service: Open Heart Surgery;  Laterality: N/A;  Times 5. Using endoscopically harvested right  greater saphenous vein and left internal mammary artery.   Marland Kitchen KNEE ARTHROSCOPY Left 1980's  . LEFT HEART CATHETERIZATION WITH CORONARY ANGIOGRAM N/A 04/12/2011   Procedure: LEFT HEART CATHETERIZATION WITH CORONARY ANGIOGRAM;  Surgeon: [name], MD;  Location: [facility] CATH LAB;  Service: Cardiovascular;  Laterality: N/A;  . LOWER EXTREMITY ANGIOGRAM  01/29/2013   DR Jacinto Halim  . LOWER EXTREMITY ANGIOGRAM N/A 05/10/2011   Procedure: LOWER EXTREMITY ANGIOGRAM;  Surgeon: [name], MD;  Location: [facility] CATH LAB;  Service: Cardiovascular;  Laterality: N/A;  . LOWER EXTREMITY ANGIOGRAM N/A 08/16/2011   Procedure: LOWER EXTREMITY ANGIOGRAM;  Surgeon: [name], MD;  Location: [facility] CATH LAB;  Service: Cardiovascular;  Laterality: N/A;  . LOWER EXTREMITY ANGIOGRAM N/A 12/13/2011   Procedure: LOWER  EXTREMITY ANGIOGRAM;  Surgeon: [name], MD;  Location: [facility] CATH LAB;  Service: Cardiovascular;  Laterality: N/A;  . LOWER EXTREMITY ANGIOGRAM N/A 02/14/2012   Procedure: LOWER EXTREMITY ANGIOGRAM;  Surgeon: [name], MD;  Location: [facility] CATH LAB;  Service: Cardiovascular;  Laterality: N/A;  . LOWER EXTREMITY ANGIOGRAM N/A 05/01/2012   Procedure: LOWER EXTREMITY ANGIOGRAM;  Surgeon: [name], MD;  Location: [facility] CATH LAB;  Service: Cardiovascular;  Laterality: N/A;  . LOWER EXTREMITY ANGIOGRAM N/A  01/29/2013   Procedure: LOWER EXTREMITY ANGIOGRAM;  Surgeon: [name], MD;  Location: [facility] CATH LAB;  Service: Cardiovascular;  Laterality: N/A;  . LOWER EXTREMITY ANGIOGRAM N/A 06/04/2013   Procedure: LOWER EXTREMITY ANGIOGRAM;  Surgeon: [name], MD;  Location: [facility] CATH LAB;  Service: Cardiovascular;  Laterality: N/A;  . LOWER EXTREMITY ANGIOGRAM Right 07/09/2013   Procedure: LOWER EXTREMITY ANGIOGRAM;  Surgeon: [name], MD;  Location: [facility] CATH LAB;  Service: Cardiovascular;  Laterality: Right;  . LOWER EXTREMITY ANGIOGRAM N/A 01/28/2014   Procedure: LOWER EXTREMITY ANGIOGRAM;  Surgeon: [name], MD;  Location: [facility] CATH LAB;  Service: Cardiovascular;  Laterality: N/A;  . PERCUTANEOUS STENT INTERVENTION Left 05/01/2012   Procedure: PERCUTANEOUS STENT INTERVENTION;  Surgeon: [name], MD;  Location: [facility] CATH LAB;  Service: Cardiovascular;  Laterality: Left;  . PERIPHERAL VASCULAR CATHETERIZATION N/A 09/02/2014   Procedure: Lower Extremity Angiography;  Surgeon: [name], MD;  Location: [facility] INVASIVE CV LAB;  Service: Cardiovascular;  Laterality: N/A;       Home Medications    Prior to Admission medications   Medication Sig Start Date End Date Taking? Authorizing Provider  acetaminophen (TYLENOL) 500 MG tablet Take 1,000 mg by mouth every 6 (six) hours as needed for mild pain, moderate pain, fever or headache.    Yes [provider]  aspirin EC 81 MG tablet Take 81 mg by mouth every evening.    Yes [provider]  atorvastatin (LIPITOR) 40 MG tablet Take 40 mg by mouth daily with breakfast.    Yes [provider]  Cholecalciferol (VITAMIN D) 2000 UNITS CAPS Take 8,000 Units by mouth daily.    Yes [provider]  clopidogrel (PLAVIX) 75 MG tablet Take 75 mg by mouth at bedtime.    Yes [provider]  Cyanocobalamin (VITAMIN B 12 PO) Take 1 tablet by mouth daily.   Yes [provider]    Dapagliflozin-Metformin HCl ER (XIGDUO XR) 07-998 MG TB24 Take 1 tablet by mouth daily. Patient taking differently: Take 1 tablet by mouth 2 (two) times daily.  09/04/14  Yes [name], MD  fenofibrate 54 MG tablet Take 54 mg by mouth daily with breakfast.    Yes [provider]  Icosapent Ethyl (VASCEPA) 1 G CAPS Take 1 g by mouth 3 (three) times daily.    Yes [provider]  insulin aspart (NOVOLOG) 100 UNIT/ML injection Inject 6-12 Units into the skin 3 (three) times daily with meals. On sliding scale   Yes [provider]  LEVEMIR 100 UNIT/ML injection Inject 30 units two times a day 04/13/15  Yes [provider]  meloxicam (MOBIC) 15 MG tablet Take 15 mg by mouth daily as needed for pain.   Yes [provider]  meloxicam (MOBIC) 7.5 MG tablet Take 7.5 mg by mouth 2 (two) times daily as needed for pain.   Yes [provider]  Naphazoline HCl (CLEAR EYES OP) Apply 2 drops to eye  2 (two) times daily as needed (for allergy season).    Yes [provider]  propranolol (INDERAL) 40 MG tablet Take 40 mg by mouth 3 (three) times daily.   Yes Doree FudgeZimmerman, Donielle M, PA-C  ramipril (ALTACE) 5 MG capsule Take 5 mg by mouth daily with breakfast.    Yes [provider]  Vorapaxar Sulfate (ZONTIVITY) 2.08 MG TABS Take 2.08 mg by mouth daily with breakfast.    Yes [provider]  HYDROcodone-acetaminophen (NORCO/VICODIN) 5-325 MG tablet Take 1 tablet by mouth every 6 (six) hours as needed. 09/21/16   Rasool Rommel, Chase PicketJaime Pilcher, PA-C  ibuprofen (ADVIL,MOTRIN) 600 MG tablet Take 1 tablet (600 mg total) by mouth every 6 (six) hours as needed. 09/21/16   Venancio Chenier, Chase PicketJaime Pilcher, PA-C  ondansetron (ZOFRAN ODT) 4 MG disintegrating tablet Take 1 tablet (4 mg total) by mouth every 8 (eight) hours as needed for nausea or vomiting. Patient not taking: Reported on 09/21/2016 06/21/16   Iliyana Convey, Chase PicketJaime Pilcher, PA-C    Family History History reviewed. No  pertinent family history.  Social History Social History  Substance Use Topics  . Smoking status: Former Smoker    Packs/day: 2.00    Years: 40.00    Types: Cigarettes    Quit date: 05/22/2011  . Smokeless tobacco: Never Used  . Alcohol use 3.6 oz/week    6 Glasses of wine per week     Allergies   Tape   Review of Systems Review of Systems  Genitourinary: Positive for flank pain.  All other systems reviewed and are negative.    Physical Exam Updated Vital Signs BP (!) 162/71 (BP Location: Right Arm)   Pulse 67   Temp (!) 97.5 F (36.4 C) (Oral)   Resp 16   SpO2 97%   Physical Exam  Constitutional: He is oriented to person, place, and time. He appears well-developed and well-nourished. No distress.  Non-toxic appearing.  HENT:  Head: Normocephalic and atraumatic.  Cardiovascular: Normal rate, regular rhythm and normal heart sounds.   No murmur heard. Pulmonary/Chest: Effort normal and breath sounds normal. No respiratory distress.  Abdominal: Soft. Bowel sounds are normal. He exhibits no distension.  Tenderness to palpation along right flank with no overlying skin changes. No abdominal or CVA tenderness.  Musculoskeletal: He exhibits no edema.  Neurological: He is alert and oriented to person, place, and time.  Skin: Skin is warm and dry.  Nursing note and vitals reviewed.    ED Treatments / Results  Labs (all labs ordered are listed, but only abnormal results are displayed) Labs Reviewed  URINALYSIS, ROUTINE W REFLEX MICROSCOPIC - Abnormal; Notable for the following:       Result Value   Color, Urine STRAW (*)    Glucose, UA >=500 (*)    All other components within normal limits  COMPREHENSIVE METABOLIC PANEL - Abnormal; Notable for the following:    Glucose, Bld 199 (*)    All other components within normal limits  CBC WITH DIFFERENTIAL/PLATELET    EKG  EKG Interpretation None       Radiology No results found.  Procedures Procedures  (including critical care time)  Medications Ordered in ED Medications  sodium chloride 0.9 % bolus 1,000 mL (1,000 mLs Intravenous New Bag/Given 09/21/16 1212)  ketorolac (TORADOL) 15 MG/ML injection 15 mg (15 mg Intravenous Given 09/21/16 1211)     Initial Impression / Assessment and Plan / ED Course  I have reviewed the triage vital signs and the nursing  notes.  Pertinent labs & imaging results that were available during my care of the patient were reviewed by me and considered in my medical decision making (see chart for details).    LAMARIUS DIRR is a 66 y.o. male who presents to ED for right flank pain c/w his prior kidney stones. Patient seen on 06/08/15 and 06/21/16 for kidney stones. CT obtained at both visits showing nonobstructive stone in the area of pain all of which 4 mm or smaller. Stones have always passed within 2 days. On exam today, patient is afebrile with no abdominal tenderness. He has tenderness along the right flank. Pain is waxing and waning and consistent with ureteral colic. Labs reassuring. Normal creatinine and white count. No signs of infection on UA. Pain controlled in ED. Evaluation does not show pathology that would require ongoing emergent intervention or inpatient treatment. Patient agrees to call urologist for follow up. Reasons to return to ER and home care instructions discussed. All questions answered.   Patient seen by and discussed with Dr. Madilyn Hook who agrees with treatment plan.    Final Clinical Impressions(s) / ED Diagnoses   Final diagnoses:  Ureteral colic    New Prescriptions New Prescriptions   HYDROCODONE-ACETAMINOPHEN (NORCO/VICODIN) 5-325 MG TABLET    Take 1 tablet by mouth every 6 (six) hours as needed.   IBUPROFEN (ADVIL,MOTRIN) 600 MG TABLET    Take 1 tablet (600 mg total) by mouth every 6 (six) hours as needed.     Merly Hinkson, Chase Picket, PA-C 09/21/16 1346    Tilden Fossa, MD 09/22/16 1048

## 2016-09-21 NOTE — ED Triage Notes (Signed)
Pt c/o right flank pain onset 0815, woke pt from sleep. No urinary symptoms, hematuria, nausea, emesis. Hx similar pain 3 months ago and 3 months before that, all diagnosed as renal calculi. No CVAT.

## 2016-09-26 DIAGNOSIS — N200 Calculus of kidney: Secondary | ICD-10-CM | POA: Diagnosis not present

## 2016-12-05 DIAGNOSIS — I70219 Atherosclerosis of native arteries of extremities with intermittent claudication, unspecified extremity: Secondary | ICD-10-CM | POA: Diagnosis not present

## 2016-12-05 DIAGNOSIS — I2510 Atherosclerotic heart disease of native coronary artery without angina pectoris: Secondary | ICD-10-CM | POA: Diagnosis not present

## 2016-12-05 DIAGNOSIS — I10 Essential (primary) hypertension: Secondary | ICD-10-CM | POA: Diagnosis not present

## 2016-12-05 DIAGNOSIS — E1151 Type 2 diabetes mellitus with diabetic peripheral angiopathy without gangrene: Secondary | ICD-10-CM | POA: Diagnosis not present

## 2016-12-14 DIAGNOSIS — E118 Type 2 diabetes mellitus with unspecified complications: Secondary | ICD-10-CM | POA: Diagnosis not present

## 2016-12-14 DIAGNOSIS — E7800 Pure hypercholesterolemia, unspecified: Secondary | ICD-10-CM | POA: Diagnosis not present

## 2016-12-14 DIAGNOSIS — I10 Essential (primary) hypertension: Secondary | ICD-10-CM | POA: Diagnosis not present

## 2017-02-15 DIAGNOSIS — M779 Enthesopathy, unspecified: Secondary | ICD-10-CM | POA: Diagnosis not present

## 2017-02-15 DIAGNOSIS — M79643 Pain in unspecified hand: Secondary | ICD-10-CM | POA: Diagnosis not present

## 2017-02-15 DIAGNOSIS — M1990 Unspecified osteoarthritis, unspecified site: Secondary | ICD-10-CM | POA: Diagnosis not present

## 2017-02-15 DIAGNOSIS — M720 Palmar fascial fibromatosis [Dupuytren]: Secondary | ICD-10-CM | POA: Diagnosis not present

## 2017-04-19 DIAGNOSIS — I10 Essential (primary) hypertension: Secondary | ICD-10-CM | POA: Diagnosis not present

## 2017-04-19 DIAGNOSIS — E118 Type 2 diabetes mellitus with unspecified complications: Secondary | ICD-10-CM | POA: Diagnosis not present

## 2017-04-19 DIAGNOSIS — E7800 Pure hypercholesterolemia, unspecified: Secondary | ICD-10-CM | POA: Diagnosis not present

## 2017-06-05 DIAGNOSIS — I10 Essential (primary) hypertension: Secondary | ICD-10-CM | POA: Diagnosis not present

## 2017-06-05 DIAGNOSIS — E1151 Type 2 diabetes mellitus with diabetic peripheral angiopathy without gangrene: Secondary | ICD-10-CM | POA: Diagnosis not present

## 2017-06-05 DIAGNOSIS — I2510 Atherosclerotic heart disease of native coronary artery without angina pectoris: Secondary | ICD-10-CM | POA: Diagnosis not present

## 2017-06-05 DIAGNOSIS — I70219 Atherosclerosis of native arteries of extremities with intermittent claudication, unspecified extremity: Secondary | ICD-10-CM | POA: Diagnosis not present

## 2017-06-07 DIAGNOSIS — I70219 Atherosclerosis of native arteries of extremities with intermittent claudication, unspecified extremity: Secondary | ICD-10-CM | POA: Diagnosis not present

## 2017-06-14 DIAGNOSIS — R05 Cough: Secondary | ICD-10-CM | POA: Diagnosis not present

## 2017-06-14 DIAGNOSIS — E7800 Pure hypercholesterolemia, unspecified: Secondary | ICD-10-CM | POA: Diagnosis not present

## 2017-06-14 DIAGNOSIS — I2510 Atherosclerotic heart disease of native coronary artery without angina pectoris: Secondary | ICD-10-CM | POA: Diagnosis not present

## 2017-06-14 DIAGNOSIS — J329 Chronic sinusitis, unspecified: Secondary | ICD-10-CM | POA: Diagnosis not present

## 2017-06-14 DIAGNOSIS — I10 Essential (primary) hypertension: Secondary | ICD-10-CM | POA: Diagnosis not present

## 2017-06-14 DIAGNOSIS — E118 Type 2 diabetes mellitus with unspecified complications: Secondary | ICD-10-CM | POA: Diagnosis not present

## 2017-06-23 DIAGNOSIS — E119 Type 2 diabetes mellitus without complications: Secondary | ICD-10-CM | POA: Diagnosis not present

## 2017-06-23 DIAGNOSIS — I2510 Atherosclerotic heart disease of native coronary artery without angina pectoris: Secondary | ICD-10-CM | POA: Diagnosis not present

## 2017-06-23 DIAGNOSIS — I10 Essential (primary) hypertension: Secondary | ICD-10-CM | POA: Diagnosis not present

## 2017-07-10 DIAGNOSIS — E7800 Pure hypercholesterolemia, unspecified: Secondary | ICD-10-CM | POA: Diagnosis not present

## 2017-07-10 DIAGNOSIS — R05 Cough: Secondary | ICD-10-CM | POA: Diagnosis not present

## 2017-07-10 DIAGNOSIS — E118 Type 2 diabetes mellitus with unspecified complications: Secondary | ICD-10-CM | POA: Diagnosis not present

## 2017-07-10 DIAGNOSIS — R062 Wheezing: Secondary | ICD-10-CM | POA: Diagnosis not present

## 2017-07-19 DIAGNOSIS — I2510 Atherosclerotic heart disease of native coronary artery without angina pectoris: Secondary | ICD-10-CM | POA: Diagnosis not present

## 2017-07-19 DIAGNOSIS — R9431 Abnormal electrocardiogram [ECG] [EKG]: Secondary | ICD-10-CM | POA: Diagnosis not present

## 2017-07-31 IMAGING — CT CT RENAL STONE PROTOCOL
2 of 3 series · 16 of 46 positions shown, 18 images · non-contrast
Comparison: 06/08/2015 CT

CLINICAL DATA: Right flank pain since 4 p.m. patient states he also
passed a renal stone on 06/11/2016.

EXAM:
CT ABDOMEN AND PELVIS WITHOUT CONTRAST
TECHNIQUE: Multidetector CT imaging of the abdomen and pelvis was performed
following the standard protocol without IV contrast.

[Series 3: lung · axial · 0.78mm/px · z∈[-162,-74]mm · 13 of 52 slices shown, 15 images]
[im 4/52  soft-tissue]
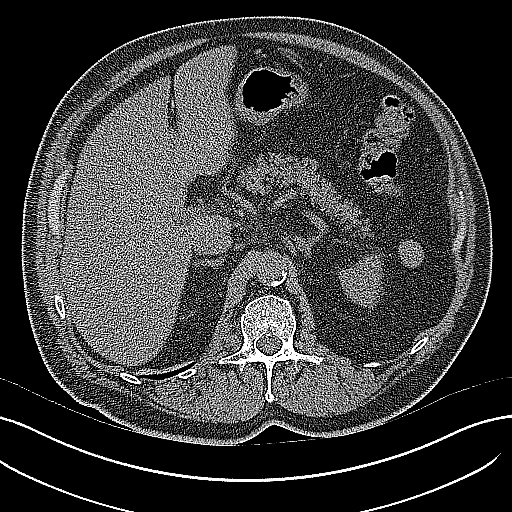
[im 4/52  bone]
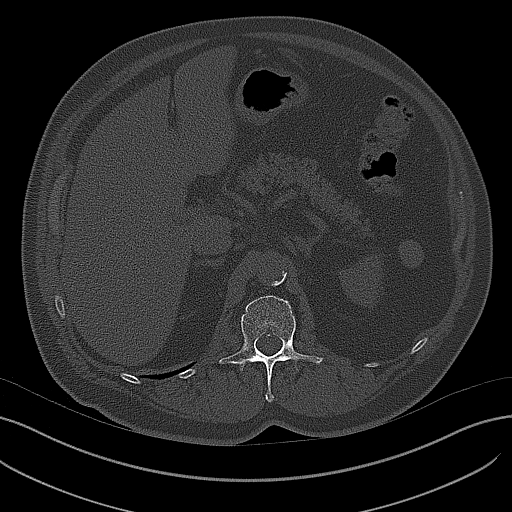
[im 7/52  soft-tissue]
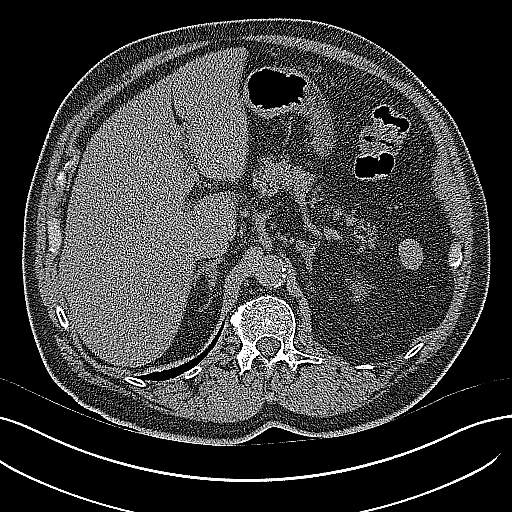
[im 10/52  soft-tissue]
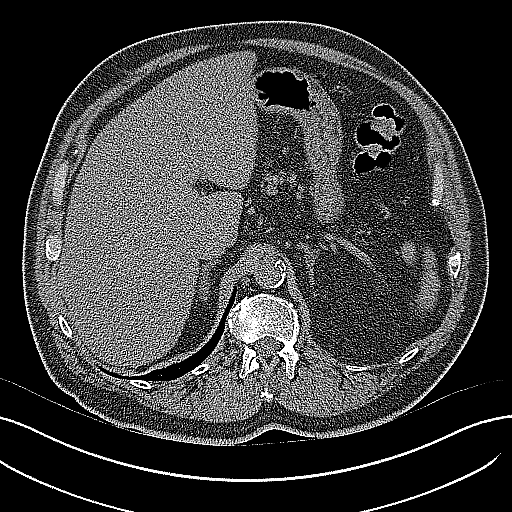
[im 15/52  soft-tissue]
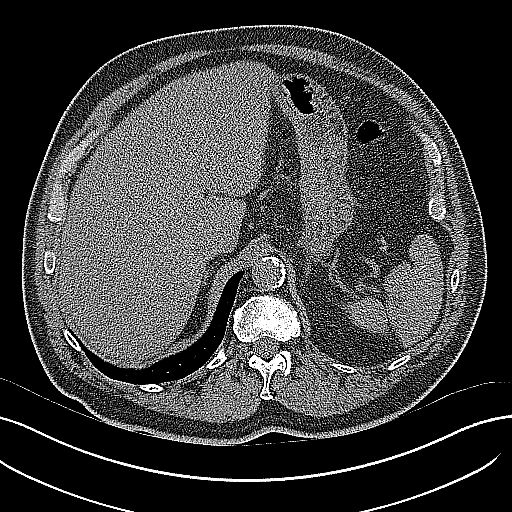
[im 19/52  soft-tissue]
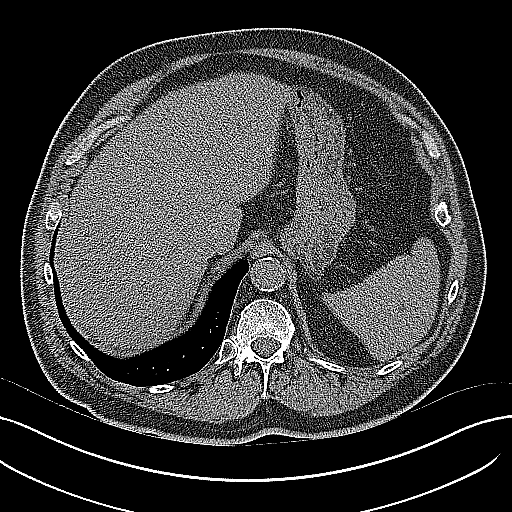
[im 22/52  soft-tissue]
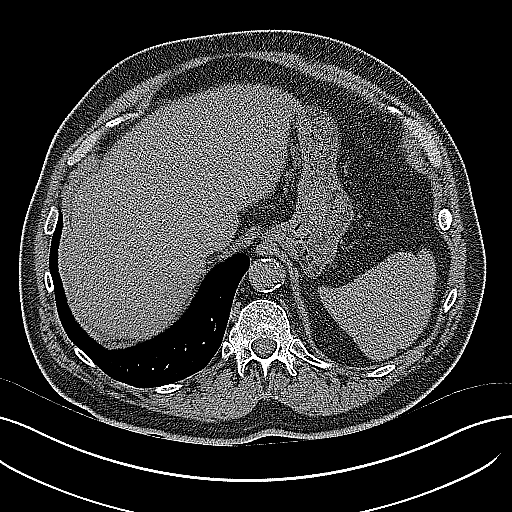
[im 27/52  soft-tissue]
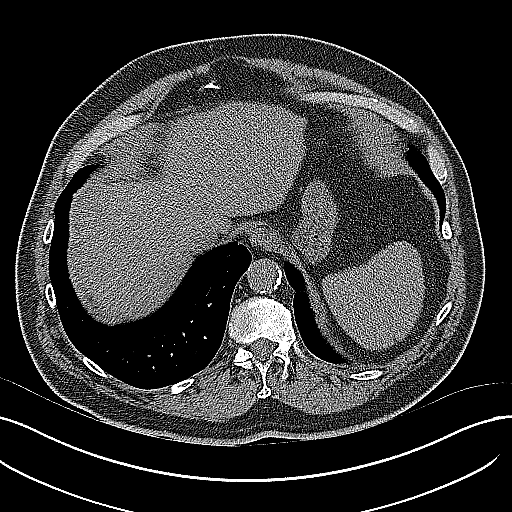
[im 30/52  soft-tissue]
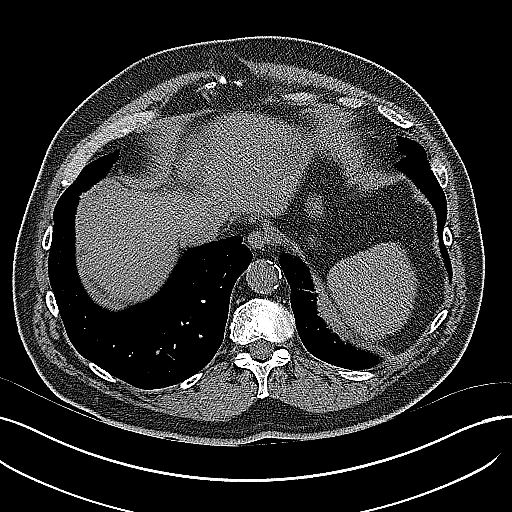
[im 33/52  soft-tissue]
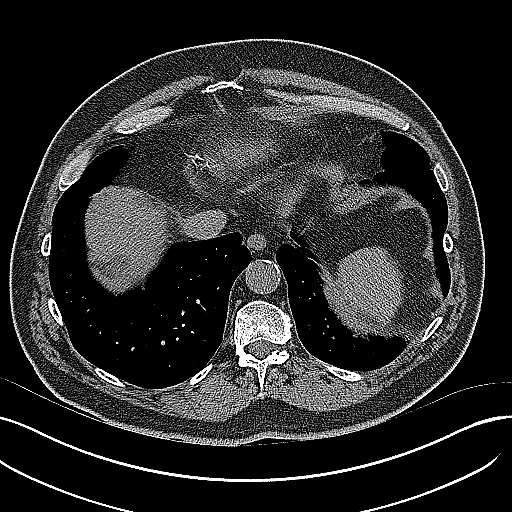
[im 33/52  bone]
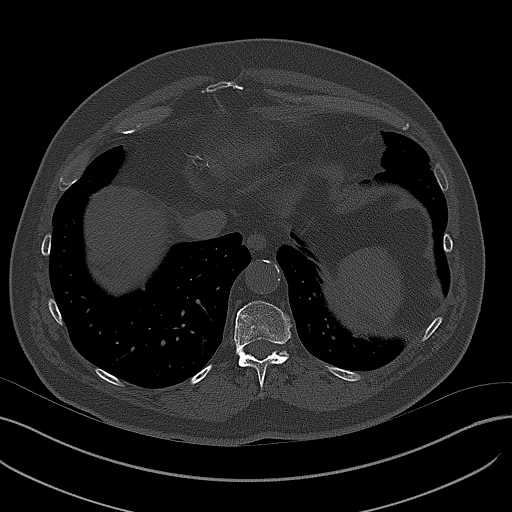
[im 37/52  soft-tissue]
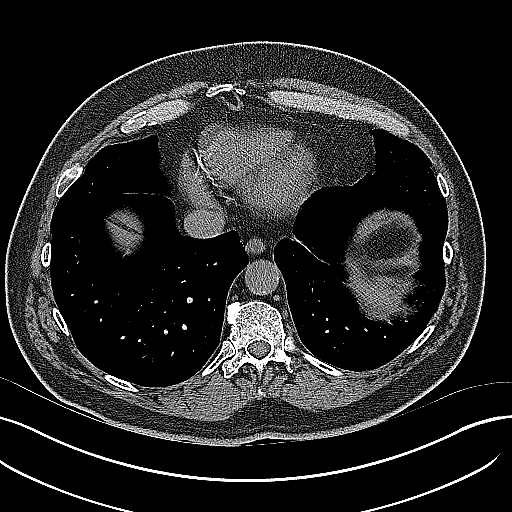
[im 42/52  soft-tissue]
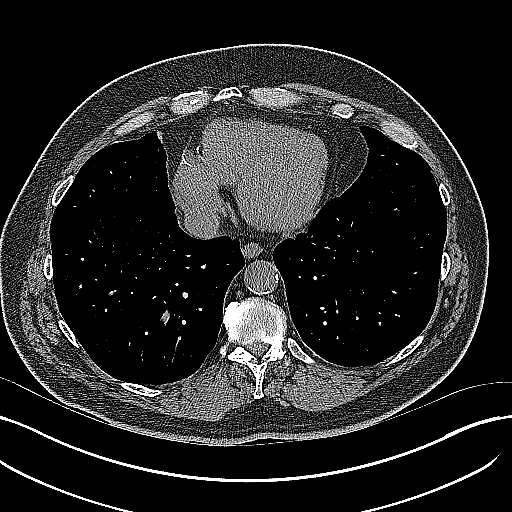
[im 45/52  soft-tissue]
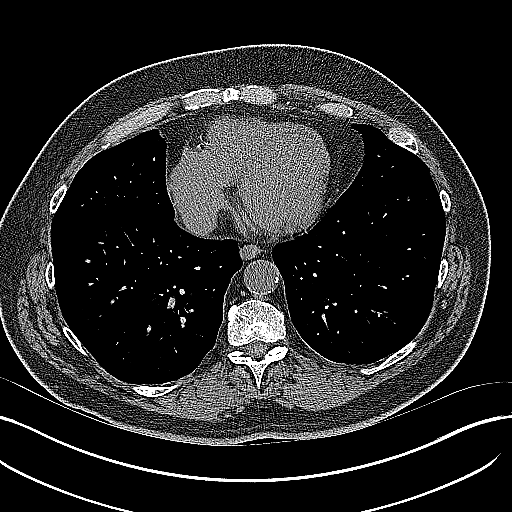
[im 48/52  soft-tissue]
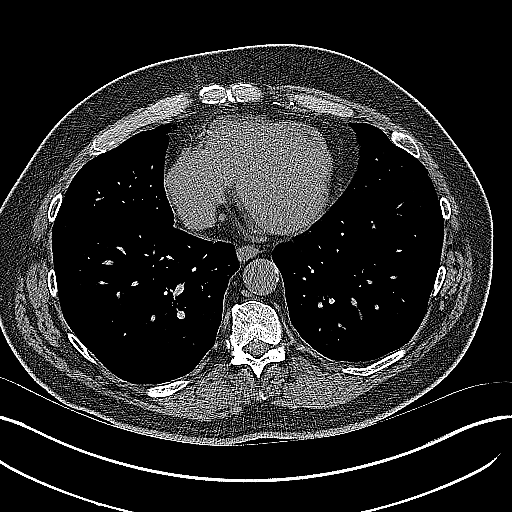

[Series 5: coronal · coronal · 0.81mm/px · 3 of 173 slices shown]
[im 58/173  soft-tissue]
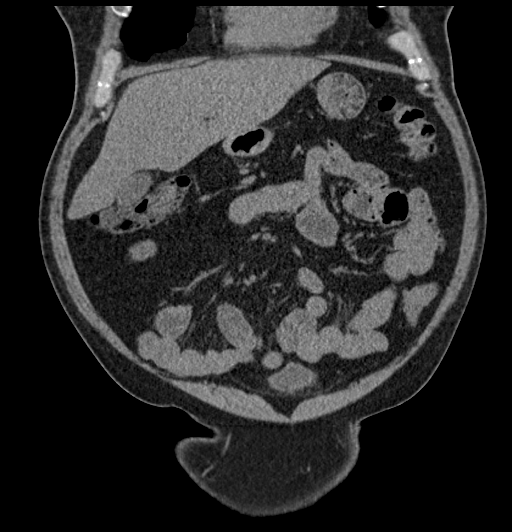
[im 77/173  soft-tissue]
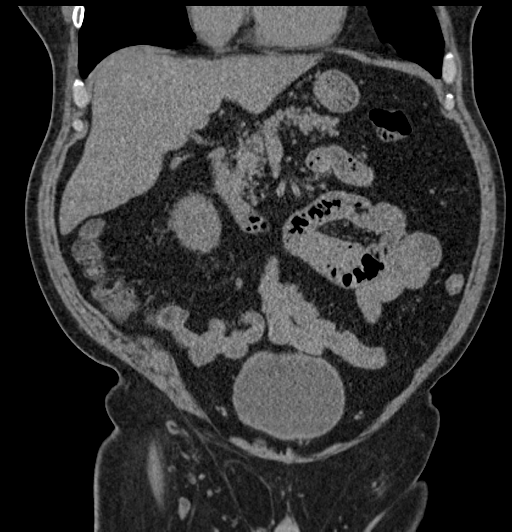
[im 96/173  soft-tissue]
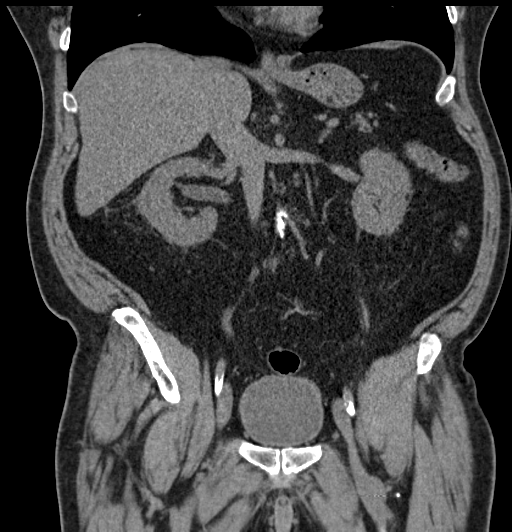

[16 of 46 positions shown; findings below may reference images not displayed]

FINDINGS: Lower chest: No acute abnormality.

Hepatobiliary: No focal liver abnormality is seen. No gallstones,
gallbladder wall thickening, or biliary dilatation.

Pancreas: Unremarkable. No pancreatic ductal dilatation or
surrounding inflammatory changes.

Spleen: A small splenule is seen adjacent to the anterior aspect of
the spleen unchanged in appearance measuring 2.3 cm.

Adrenals/Urinary Tract: Normal bilateral adrenal glands. Bilateral
renal calcifications more vascular in appearance on the left than on
prior. There is mild to moderate right-sided hydroureteronephrosis
secondary to a distal 3 mm calculus approximate 1 cm from the UVJ
and a second which appears to have almost completely if not
completely passed into the bladder through the interstitial portion
of the right ureter. The largest calcification remaining is in the
lower pole the right kidney measuring up to 11 mm, increased in size
7 mm previously. No left-sided obstruction. Nonspecific mild
perinephric fat stranding is again noted which appears chronic. The
urinary bladder is physiologically distended.

Stomach/Bowel: Unremarkable stomach. No small bowel obstruction
although there are a few small bowel loops containing air-fluid
possibly representing a mild localized ileus. Submucosal fatty
changes are seen the terminal ileum and cecum possibly likely
representing chronic changes bowel inflammation.

Vascular/Lymphatic: Diffuse aortoiliac atherosclerosis with
calcifications. No lymphadenopathy.

Reproductive: Normal size prostate and seminal vesicles.

Other: Small fat containing umbilical hernia. Mild subcutaneous
fatty induration overlying the no intra lower quadrants of the
abdomen bilaterally, chronic in appearance and possibly due to
administration of subcutaneous medication.

Musculoskeletal: No acute or significant osseous findings.
IMPRESSION: 1. Mild-to-moderate right-sided hydroureteronephrosis attributable
to tandem calculi in the distal right ureter, one which has or
almost has passed completely into the bladder measuring 4 mm and a
more proximal 3 mm calcification seen approximately 1 cm from the
UVJ.
2. Bilateral nephrolithiasis as before, the largest is in the lower
pole the right kidney now estimated at 11 mm maximum.
3. Slight fluid-filled and gas-filled distention of small bowel
loops may represent generalized small bowel ileus due to the
ureteral obstruction

## 2017-10-09 DIAGNOSIS — I10 Essential (primary) hypertension: Secondary | ICD-10-CM | POA: Diagnosis not present

## 2017-10-09 DIAGNOSIS — I2510 Atherosclerotic heart disease of native coronary artery without angina pectoris: Secondary | ICD-10-CM | POA: Diagnosis not present

## 2017-10-09 DIAGNOSIS — E119 Type 2 diabetes mellitus without complications: Secondary | ICD-10-CM | POA: Diagnosis not present

## 2017-10-09 DIAGNOSIS — E785 Hyperlipidemia, unspecified: Secondary | ICD-10-CM | POA: Diagnosis not present

## 2017-11-02 DIAGNOSIS — E785 Hyperlipidemia, unspecified: Secondary | ICD-10-CM | POA: Diagnosis not present

## 2017-11-02 DIAGNOSIS — E119 Type 2 diabetes mellitus without complications: Secondary | ICD-10-CM | POA: Diagnosis not present

## 2017-11-02 DIAGNOSIS — R635 Abnormal weight gain: Secondary | ICD-10-CM | POA: Diagnosis not present

## 2017-11-09 DIAGNOSIS — E785 Hyperlipidemia, unspecified: Secondary | ICD-10-CM | POA: Diagnosis not present

## 2017-11-09 DIAGNOSIS — E119 Type 2 diabetes mellitus without complications: Secondary | ICD-10-CM | POA: Diagnosis not present

## 2017-11-09 DIAGNOSIS — I2510 Atherosclerotic heart disease of native coronary artery without angina pectoris: Secondary | ICD-10-CM | POA: Diagnosis not present

## 2017-11-09 DIAGNOSIS — I10 Essential (primary) hypertension: Secondary | ICD-10-CM | POA: Diagnosis not present

## 2017-11-16 DIAGNOSIS — I739 Peripheral vascular disease, unspecified: Secondary | ICD-10-CM | POA: Diagnosis not present

## 2017-11-16 DIAGNOSIS — I25810 Atherosclerosis of coronary artery bypass graft(s) without angina pectoris: Secondary | ICD-10-CM | POA: Diagnosis not present

## 2017-11-16 DIAGNOSIS — I10 Essential (primary) hypertension: Secondary | ICD-10-CM | POA: Diagnosis not present

## 2017-11-16 DIAGNOSIS — I743 Embolism and thrombosis of arteries of the lower extremities: Secondary | ICD-10-CM | POA: Diagnosis not present

## 2018-01-30 DIAGNOSIS — E119 Type 2 diabetes mellitus without complications: Secondary | ICD-10-CM | POA: Diagnosis not present

## 2018-02-05 DIAGNOSIS — E785 Hyperlipidemia, unspecified: Secondary | ICD-10-CM | POA: Diagnosis not present

## 2018-02-05 DIAGNOSIS — I10 Essential (primary) hypertension: Secondary | ICD-10-CM | POA: Diagnosis not present

## 2018-02-05 DIAGNOSIS — Z23 Encounter for immunization: Secondary | ICD-10-CM | POA: Diagnosis not present

## 2018-02-05 DIAGNOSIS — I2510 Atherosclerotic heart disease of native coronary artery without angina pectoris: Secondary | ICD-10-CM | POA: Diagnosis not present

## 2018-02-05 DIAGNOSIS — E119 Type 2 diabetes mellitus without complications: Secondary | ICD-10-CM | POA: Diagnosis not present

## 2018-03-01 DIAGNOSIS — I25810 Atherosclerosis of coronary artery bypass graft(s) without angina pectoris: Secondary | ICD-10-CM | POA: Diagnosis not present

## 2018-03-01 DIAGNOSIS — R05 Cough: Secondary | ICD-10-CM | POA: Diagnosis not present

## 2018-03-01 DIAGNOSIS — I10 Essential (primary) hypertension: Secondary | ICD-10-CM | POA: Diagnosis not present

## 2018-03-01 DIAGNOSIS — R0989 Other specified symptoms and signs involving the circulatory and respiratory systems: Secondary | ICD-10-CM | POA: Diagnosis not present

## 2018-03-29 DIAGNOSIS — I70211 Atherosclerosis of native arteries of extremities with intermittent claudication, right leg: Secondary | ICD-10-CM | POA: Diagnosis not present

## 2018-03-29 DIAGNOSIS — Z87891 Personal history of nicotine dependence: Secondary | ICD-10-CM | POA: Diagnosis not present

## 2018-05-16 DIAGNOSIS — I2510 Atherosclerotic heart disease of native coronary artery without angina pectoris: Secondary | ICD-10-CM | POA: Diagnosis not present

## 2018-05-16 DIAGNOSIS — E785 Hyperlipidemia, unspecified: Secondary | ICD-10-CM | POA: Diagnosis not present

## 2018-05-16 DIAGNOSIS — R202 Paresthesia of skin: Secondary | ICD-10-CM | POA: Diagnosis not present

## 2018-05-16 DIAGNOSIS — I10 Essential (primary) hypertension: Secondary | ICD-10-CM | POA: Diagnosis not present

## 2018-05-16 DIAGNOSIS — R350 Frequency of micturition: Secondary | ICD-10-CM | POA: Diagnosis not present

## 2018-05-16 DIAGNOSIS — E559 Vitamin D deficiency, unspecified: Secondary | ICD-10-CM | POA: Diagnosis not present

## 2018-05-16 DIAGNOSIS — E119 Type 2 diabetes mellitus without complications: Secondary | ICD-10-CM | POA: Diagnosis not present

## 2018-05-17 DIAGNOSIS — E785 Hyperlipidemia, unspecified: Secondary | ICD-10-CM | POA: Diagnosis not present

## 2018-05-17 DIAGNOSIS — I10 Essential (primary) hypertension: Secondary | ICD-10-CM | POA: Diagnosis not present

## 2018-05-17 DIAGNOSIS — I2510 Atherosclerotic heart disease of native coronary artery without angina pectoris: Secondary | ICD-10-CM | POA: Diagnosis not present

## 2018-05-17 DIAGNOSIS — E119 Type 2 diabetes mellitus without complications: Secondary | ICD-10-CM | POA: Diagnosis not present
